# Patient Record
Sex: Female | Born: 1940 | Race: White | Hispanic: No | Marital: Married | Smoking: Current every day smoker
Health system: Southern US, Community
[De-identification: ages and names within clinical notes are randomized; demographics above are authoritative.]

## PROBLEM LIST (undated history)

## (undated) DIAGNOSIS — I10 Essential (primary) hypertension: Secondary | ICD-10-CM

## (undated) DIAGNOSIS — I209 Angina pectoris, unspecified: Secondary | ICD-10-CM

## (undated) DIAGNOSIS — I340 Nonrheumatic mitral (valve) insufficiency: Secondary | ICD-10-CM

## (undated) DIAGNOSIS — I351 Nonrheumatic aortic (valve) insufficiency: Secondary | ICD-10-CM

## (undated) DIAGNOSIS — I739 Peripheral vascular disease, unspecified: Secondary | ICD-10-CM

## (undated) DIAGNOSIS — J449 Chronic obstructive pulmonary disease, unspecified: Secondary | ICD-10-CM

## (undated) DIAGNOSIS — K219 Gastro-esophageal reflux disease without esophagitis: Secondary | ICD-10-CM

## (undated) DIAGNOSIS — I2510 Atherosclerotic heart disease of native coronary artery without angina pectoris: Secondary | ICD-10-CM

## (undated) DIAGNOSIS — E7800 Pure hypercholesterolemia, unspecified: Secondary | ICD-10-CM

## (undated) DIAGNOSIS — I779 Disorder of arteries and arterioles, unspecified: Secondary | ICD-10-CM

## (undated) DIAGNOSIS — I480 Paroxysmal atrial fibrillation: Secondary | ICD-10-CM

## (undated) HISTORY — PX: ABDOMINAL HYSTERECTOMY: SHX81

## (undated) HISTORY — DX: Paroxysmal atrial fibrillation: I48.0

## (undated) HISTORY — DX: Nonrheumatic mitral (valve) insufficiency: I34.0

## (undated) HISTORY — DX: Chronic obstructive pulmonary disease, unspecified: J44.9

## (undated) HISTORY — DX: Peripheral vascular disease, unspecified: I73.9

## (undated) HISTORY — DX: Nonrheumatic aortic (valve) insufficiency: I35.1

## (undated) HISTORY — DX: Disorder of arteries and arterioles, unspecified: I77.9

---

## 1999-02-14 HISTORY — PX: SUBCLAVIAN ARTERY STENT: SHX2452

## 1999-02-14 HISTORY — PX: OTHER SURGICAL HISTORY: SHX169

## 1999-02-14 HISTORY — PX: RENAL ARTERY STENT: SHX2321

## 1999-02-15 ENCOUNTER — Inpatient Hospital Stay (HOSPITAL_COMMUNITY): Admission: RE | Admit: 1999-02-15 | Discharge: 1999-02-21 | Payer: Self-pay | Admitting: *Deleted

## 1999-02-15 ENCOUNTER — Encounter: Payer: Self-pay | Admitting: Cardiology

## 1999-02-23 ENCOUNTER — Inpatient Hospital Stay (HOSPITAL_COMMUNITY): Admission: AD | Admit: 1999-02-23 | Discharge: 1999-02-28 | Payer: Self-pay | Admitting: Cardiovascular Disease

## 1999-04-16 HISTORY — PX: RENAL ARTERY STENT: SHX2321

## 1999-04-24 ENCOUNTER — Observation Stay (HOSPITAL_COMMUNITY): Admission: RE | Admit: 1999-04-24 | Discharge: 1999-04-25 | Payer: Self-pay | Admitting: Cardiovascular Disease

## 2003-04-16 DIAGNOSIS — I2510 Atherosclerotic heart disease of native coronary artery without angina pectoris: Secondary | ICD-10-CM

## 2003-04-16 HISTORY — PX: CARDIAC CATHETERIZATION: SHX172

## 2003-04-16 HISTORY — DX: Atherosclerotic heart disease of native coronary artery without angina pectoris: I25.10

## 2003-05-04 ENCOUNTER — Ambulatory Visit (HOSPITAL_COMMUNITY): Admission: RE | Admit: 2003-05-04 | Discharge: 2003-05-04 | Payer: Self-pay | Admitting: *Deleted

## 2003-06-14 HISTORY — PX: RENAL ARTERY ANGIOPLASTY: SHX2317

## 2003-06-14 HISTORY — PX: SUBCLAVIAN ARTERY STENT: SHX2452

## 2003-07-11 ENCOUNTER — Ambulatory Visit (HOSPITAL_COMMUNITY): Admission: RE | Admit: 2003-07-11 | Discharge: 2003-07-12 | Payer: Self-pay | Admitting: Cardiovascular Disease

## 2003-07-14 ENCOUNTER — Emergency Department (HOSPITAL_COMMUNITY): Admission: EM | Admit: 2003-07-14 | Discharge: 2003-07-14 | Payer: Self-pay | Admitting: Emergency Medicine

## 2005-06-18 ENCOUNTER — Ambulatory Visit (HOSPITAL_COMMUNITY): Admission: RE | Admit: 2005-06-18 | Discharge: 2005-06-18 | Payer: Self-pay | Admitting: Internal Medicine

## 2005-06-18 ENCOUNTER — Ambulatory Visit: Payer: Self-pay | Admitting: Internal Medicine

## 2005-06-18 ENCOUNTER — Encounter (INDEPENDENT_AMBULATORY_CARE_PROVIDER_SITE_OTHER): Payer: Self-pay | Admitting: Internal Medicine

## 2005-09-11 ENCOUNTER — Ambulatory Visit (HOSPITAL_COMMUNITY): Admission: RE | Admit: 2005-09-11 | Discharge: 2005-09-11 | Payer: Self-pay | Admitting: Internal Medicine

## 2009-11-13 ENCOUNTER — Ambulatory Visit (HOSPITAL_COMMUNITY): Admission: RE | Admit: 2009-11-13 | Discharge: 2009-11-13 | Payer: Self-pay | Admitting: Cardiovascular Disease

## 2009-11-13 HISTORY — PX: SUBCLAVIAN ARTERY STENT: SHX2452

## 2009-11-13 HISTORY — PX: RENAL ARTERY ANGIOPLASTY: SHX2317

## 2009-11-16 ENCOUNTER — Ambulatory Visit (HOSPITAL_COMMUNITY): Admission: RE | Admit: 2009-11-16 | Discharge: 2009-11-17 | Payer: Self-pay | Admitting: Cardiovascular Disease

## 2010-06-29 LAB — BASIC METABOLIC PANEL
BUN: 18 mg/dL (ref 6–23)
Creatinine, Ser: 1.05 mg/dL (ref 0.4–1.2)
Glucose, Bld: 124 mg/dL — ABNORMAL HIGH (ref 70–99)
Potassium: 3.7 mEq/L (ref 3.5–5.1)
Sodium: 132 mEq/L — ABNORMAL LOW (ref 135–145)

## 2010-06-29 LAB — CBC
HCT: 35.4 % — ABNORMAL LOW (ref 36.0–46.0)
MCH: 31.3 pg (ref 26.0–34.0)
MCHC: 32.8 g/dL (ref 30.0–36.0)
MCV: 95.4 fL (ref 78.0–100.0)
Platelets: 234 10*3/uL (ref 150–400)

## 2010-08-28 NOTE — Procedures (Signed)
Kara Pacheco, Kara Pacheco               ACCOUNT NO.:  [id number]   MEDICAL RECORD NO.:  [id number]          PATIENT TYPE:  INP   LOCATION:  2503                         FACILITY:  [facility]   PHYSICIAN:  Nanetta Batty, M.D.   DATE OF BIRTH:  1940-12-28   DATE OF PROCEDURE:  DATE OF DISCHARGE:                    PERIPHERAL VASCULAR INVASIVE PROCEDURE   PROCEDURE PERFORMED:  Peripheral angiogram/PT and stent.   Kara Pacheco is a 70 year old frail-appearing Caucasian female with a history  of ischemic heart disease and valvular heart disease as well as  peripheral vascular occlusive disease.  She had moderate coronary artery  disease by catheterization performed by Dr. Jenne Campus on May 04, 2003.  She also has moderate-to-severe aortic insufficiency as well as mitral  regurgitation.  Coronary artery bypass graft and AVR were recommended,  however the patient refused.  She has a history of bilateral renal  artery stenosis status post stenting as well as left subclavian artery  PT and stenting with known high-grade right subclavian artery stenosis  and bilateral carotid disease.  She also has aortoiliac disease.  She  continues to smoke over 1 pack per day and is recalcitrant to risk  factor modification.  Other problems include hypertension,  hyperlipidemia.  She had a negative Myoview stress test performed on  July 14, 2009.  A 2-D echocardiogram revealed a normal left ventricle  function, diastolic dysfunction, moderate mitral regurgitation and mild  aortic stenosis with moderate-to-severe AI.  Carotid Doppler's showed  moderate right and moderate-to-severe left internal carotid artery  stenosis.  Doppler's also suggested high-grade left subclavian in-stent  restenosis with to-and-fro left vertebral flow as well as what appear  to be in-stent restenosis in both renal  arteries and high-grade  bilateral iliac disease.  She does have progressive claudication.  She  presents now for angiography and  potential intervention.   PROCEDURE DESCRIPTION:  The patient was brought to the second floor  [facility] angiographic suite in the post absorptive  state.  She was premedicated with p.o. Valium, p.o. prednisone, IV  Benadryl and Pepcid for contrast allergy prophylaxis as well as IV  Valium and fentanyl.  Her left groin was prepped and shaved in the usual  sterile fashion.  1% Xylocaine was used for local anesthesia.  A 5-  French sheath was inserted into the left femoral artery using standard  Seldinger technique.  A 5-French short pigtail catheter was used for  abdominal aortography with bifemoral runoff using bolus-chase addition  and subtraction step table technique.  A 5-French long Judkins catheter  was used for selective right innominate angiography, selective left  subclavian angiography and selective bilateral renal artery angiography.  Visipaque dye was used for the entirety of the case.  Retrograde aortic  pressure was monitored during the case.  The patient did require IV  nitroglycerine for treatment of severe central hypertension.   ANGIOGRAPHIC RESULTS:  1. Right innominate:  A 90% ostial right subclavian artery stenosis      post dilatation.  2. Left subclavian:  95% in-stent restenosis.  3. Abdominal aorta:      a.     Renal  arteries - 80% bilateral in-stent restenosis.      b.     Infrarenal abdominal aorta revealed moderate calcification       and atherosclerotic changes.  4. Left lower extremity:      a.     90% calcific ostial left common iliac artery stenosis.      b.     50% sequential mid-left superficial femoral artery stenosis       with 2-vessel runoff.  5. Right lower extremity:      a.     80% hypodense calcific proximal right common iliac artery       stenosis with 2-vessel runoff.   IMPRESSION:  Kara Pacheco has in-stent restenosis within the left subclavian  and bilateral renal artery stents as well as high-grade calcific iliac   disease.  I believe her iliac stenoses would best be treated with  diamond-back orbital rotational atherectomy, percutaneous transluminal  angioplasty and stenting.  We will proceed with percutaneous  transluminal angioplasty and re-stenting of her  subclavian renal  arteries.   A 5-French sheath was exchanged over wire for a 6-French sheath.  The  patient received Angiomax bolus because of a question of an allergy to  heparin.  Visipaque dye was used for the entirety of the intervention.  Retrograde aortic pressures were monitored during the case.   Using a short 6-French JR4 guide catheter along with an 0.14 190 [place]-  Core wire and a 40 x 15 Aviator balloon, percutaneous transluminal  angioplasty on the right renal artery.  In-stent restenosis had nominal  pressures.  Following this, a 5 x 15 Genesis on Aviator balloon stent  premount was then carefully placed across the stenotic area and dilated  to 10 to 12 atmospheres resulting in reduction of an 80% in-stent  restenosis to a less than 20% without dissection.  Attempts were than  made to intervene on the left renal with both the short right Judkins  and short IMA catheters; however, I was never able to fully engage andpass a wire into the left renal artery.   Following this, a Woolly wire was then used to gain access to the  thoracic aorta and a long 5-French JR4 catheter was then used to  cannulate the left subclavian artery stent.  The lesion was crossed with  a long 0.35 angled glide-wire and a long 4-French __________ catheter  was then used to exchange for a long 0.35 Rosen wire.  The short 6-  [place] sheath was then exchanged for a 90-cm long bright tip sheath and  predilatation was performed with a 4 x 2 PowerFlex.  Following this,  stenting was performed with an 8 x 18 Genesis on Opta balloon stent  premount and this was dilated to 10 atmospheres times 2 resulting in  reduction of a 95% in-stent restenosis to less than  20% residual with  excellent flow.  The patient tolerated the procedure well.  The long  sheath was then exchanged over wire for a short 6-French sheath.  The  patient left the lab in stable condition.  She received a total of 250  mL of contrast.   IMPRESSION:  Successful percutaneous transluminal angioplasty and re-  stenting of right renal artery in-stent restenosis and left subclavian  artery in-stent restenosis.  She has residual left renal artery in-  stent restenosis and bilateral iliac disease, which will be addressed  at a separate time.  She will be hydrated overnight, treated with  aspirin and Plavix  and discharged home in the morning.  She left the lab  in stable condition.      Nanetta Batty, M.D.     Cordelia Pen  D:  11/16/2009  T:  11/16/2009  Job:  [id number]   cc:   Angiographic Suite 2nd Floor [facility]  [facility]  Madelin Rear. Sherwood Gambler, MD   Electronically Signed by Nanetta Batty M.D. on 12/04/2009 10:20:52 AM

## 2010-08-31 NOTE — Cardiovascular Report (Signed)
NAME:  Kara Pacheco, Kara Pacheco                         ACCOUNT NO.:  [id number]   MEDICAL RECORD NO.:  [id number]                   PATIENT TYPE:  OIB   LOCATION:  2870                                 FACILITY:  [facility]   PHYSICIAN:  Darlin Priestly, M.D.             DATE OF BIRTH:  04/14/1941   DATE OF PROCEDURE:  05/04/2003  DATE OF DISCHARGE:  05/04/2003                              CARDIAC CATHETERIZATION   PROCEDURES:  1. Left heart catheterization.  2. Coronary angiography.  3. Left ventriculogram.  4. Left subclavian angiography.  5. Ascending aortography.  6. Bilateral renal angiograms.   CARDIOLOGIST:  Darlin Priestly, M.D.   COMPLICATIONS:  None.   INDICATIONS:  Kara Pacheco is a 70 year old female, patient of Dr. Artis Delay, with a history of ongoing tobacco use, history of PVD status post  left subclavian stenting as well as bilateral renal stenting.  The patient  has a history of hypertension, hyperlipidemia.  Recently she has complained  of increasing chest pain with exertion associated with diaphoresis and  nausea.  This has occasionally awoken her from sleep.  The patient is also  known to have moderate regurgitation which has been followed by serial  echocardiogram.  She is now referred for cardiac catheterization to reassess  her coronary status and to evaluate her peripheral stent.   DESCRIPTION OF PROCEDURE:  After giving informed written consent, the  patient was brought to the cardiac catheterization lab where his right and  left groins were shaved, prepped, and draped in the usual sterile fashion.  ECG monitoring was established.  Using modified Seldinger technique,  arterial access was obtained in the right femoral artery and a #6 French  arterial sheath was inserted without complications.  Then, 6 French  diagnostic catheters were then used to perform diagnostic angiography.  This  revealed a large left main with no significant disease.  The LAD is a  medium  size vessel which courses to the apex and gives rise to one diagonal branch.  The LAD is noted to be diffusely irregular with 40-50% throughout its  proximal, mid and distal segments.  There is a focal 60% stenotic lesion  after the takeoff of the first diagonal.  The first diagonal is a medium  size vessel which bifurcates distally.  Its course is irregular but it has  no high grade stenosis.   The left circumflex is a medium size vessel which courses in the AV groove  and gives rise to large obtuse marginal branches.  The AV groove circumflex  is noted to have diffuse 50-60% stenosis in the mid segment with a focal 60-  70% stenosis prior to the takeoff of the second obtuse marginal.  The first  OM is a small vessel with no significant disease.  The second OM is a medium  size vessel which bifurcates distally and has a 50% proximal stenosis.   The right coronary  artery is a large vessel which is dominant and gives rise  to the PDA as well as the posterior lateral branch.  There is an 80% early  mid and 80% distal stenotic lesion.  The PDA is noted to have 40% mid vessel  stenosis.  The PLA has no significant disease.   LEFT VENTRICULOGRAM:  The left ventriculogram reveals preserved EF of 60%  with mild mitral regurgitation.   FINDINGS:  1. The left subclavian stent appears to have approximately 60% in-stent     restenosis throughout its proximal and mid segments.  2. The left renal artery stent appears to have 80-90% in-stent restenosis     throughout its proximal and mid segment.  3. The right renal artery stent appears to have a 70-80% in-stent restenosis     throughout its proximal and mid segments.  4. Ascending aortography reveals moderate severe aortic regurgitation.   HEMODYNAMICS:  Systemic arterial pressure 177/53, LV systemic pressure 175/2  and LVEDP of 11.   CONCLUSION:  1. Significant two vessel coronary artery disease involving the left     anterior  descending and right coronary artery.  2. Normal left ventricular systolic function.  3. Mild mitral regurgitation.  4. Moderate severe aortic regurgitation.  5. 70% in-stent restenosis of the left subclavian stent.  6. 80-90% in-stent restenosis of the left renal artery stent.  7. 70-80% in-stent restenosis of the right renal artery stent.  8. Systemic hypertension.                                               Darlin Priestly, M.D.    RHM/MEDQ  D:  05/04/2003  T:  05/05/2003  Job:  [id number]   cc:   Madelin Rear. Sherwood Gambler, M.D.  [address]  [place]  [place] [address]  Fax: [phone]

## 2010-08-31 NOTE — Op Note (Signed)
NAMESAYRA, Pacheco               ACCOUNT NO.:  [id number]   MEDICAL RECORD NO.:  [id number]          PATIENT TYPE:  AMB   LOCATION:  DAY                           FACILITY:  [facility]   PHYSICIAN:  Lionel December, M.D.    DATE OF BIRTH:  [birth year]   DATE OF PROCEDURE:  06/18/2005  DATE OF DISCHARGE:                                 OPERATIVE REPORT   PROCEDURE:  Colonoscopy with polypectomy.   INDICATION:  Kara Pacheco is a 70 year old Caucasian female who is here for  surveillance colonoscopy. She had an adenoma removed on her last colonoscopy  in 2000. Family history is significant for colon carcinoma in a paternal  uncle. Procedure and risks were reviewed with the patient and informed  consent was obtained.   MEDICINES FOR CONSCIOUS SEDATION:  Demerol 25 mg IV, Versed 4 mg IV.   FINDINGS:  Procedure performed in endoscopy suite. The patient's vital signs  and O2 saturations were monitored during the procedure and remained stable.  The patient was placed in the left lateral position and rectal examination  performed. No abnormality noted on external or digital exam. Olympus  videoscope was placed in the rectum and advanced under vision, into sigmoid  colon and beyond. Preparation was excellent. Scope was passed into the cecum  which was identified by ileocecal valve and appendiceal orifice. There were  2 cecal arteriovenous malformations; one was large and one was small, to the  left of the appendiceal orifice. These were not bleeding and were left  alone. There was an 8 mm sessile polyp between the appendiceal orifice, and  the ileocecal valve. This was raised with submucosal injection of saline,  and it was snared in 1 piece. As this polyp was suctioned through the  channel, it broke into pieces. Polypectomy was complete. As the scope was  withdrawn the colonic mucosa was, once again,  carefully examined. There was  another tiny polyp at the hepatic flexure which was coagulated using  snare  tip. Mucosa and rest of the colon was normal. Rectal mucosa similarly was  normal. Scope was retroflexed to examine anorectal junction which was  unremarkable. Endoscope was straightened and withdrawn. The patient  tolerated the procedure well.   FINAL DIAGNOSIS:  1.  Nonbleeding cecal arteriovenous malformations (2) which were left alone.  2.  An 8-mm, sessile polyp snared from cecum after submucosal injection of      saline.  3.  Tiny polyp at hepatic flexure was coagulated.   RECOMMENDATIONS:  Standard instructions given. She will resume her ASA in 1  week. I will be contacting the patient with biopsy results. Given today's  findings, she will need to return for another exam in 5 years from now.     Lionel December, M.D.  Electronically Signed    NR/MEDQ  D:  06/18/2005  T:  06/18/2005  Job:  [id number]

## 2010-08-31 NOTE — Cardiovascular Report (Signed)
NAME:  Kara Pacheco, Kara Pacheco                         ACCOUNT NO.:  [id number]   MEDICAL RECORD NO.:  [id number]                   PATIENT TYPE:  OIB   LOCATION:  6522                                 FACILITY:  [facility]   PHYSICIAN:  Nanetta Batty, M.D.                DATE OF BIRTH:  09-11-1940   DATE OF PROCEDURE:  07/11/2003  DATE OF DISCHARGE:                              CARDIAC CATHETERIZATION   INDICATIONS:  Kara Pacheco is a 70 year old white female with history of  coronary artery disease, valvular heart disease and PVOD.  She has had  bilateral renal artery PTA and stenting in the past by Dr. Susa Griffins  as well as left subclavian artery PTA and stenting.  She has moderate three-  vessel disease by cath this past January with moderate aortic insufficiency  by 2-D echo.  Dr. Jenne Campus also documented in-stent restenosis within the  left subclavian and bilateral renal artery stents.  Dopplers confirmed this.  The patient presents now for PTA.   PROCEDURE DESCRIPTION:  The patient was brought to the sixth floor [facility]iographic Suite in the postabsorptive state.  She was premedicated with p.o. Valium and received IV morphine, Versed and  fentanyl.  Her right groin was prepped and shaved in the usual sterile  fashion.  1% Xylocaine was used for local anesthesia.  A 7 French sheath was  inserted into the right femoral artery using standard Seldinger technique.  A 5 French long pigtail catheter, right Judkins catheter, and JB-1 catheter  were used for arch angiography, selective right innominate, right subclavian  and left subclavian angiography, distal abdominal aortography and selective  bilateral renal angiography.  Visipaque dye was used for the entirety of the  case.  Retrograde aortic pressure was monitored during the case.   ANGIOGRAPHIC RESULTS:  1. Arch aortogram.     A. This was a bovine arch with the left common carotid arising from the  innominate vessel.  The innominate was itself free of significant        disease.  There did appear to be an 80-90% fairly focal proximal right        subclavian artery stenosis.     B. 70% in-stent restenosis with post stenotic dilatation within the left        subclavian artery stent.  2. Abdominal aortography.     A. 80% in-stent restenosis within the bilateral renal artery stents.     B. Moderate infrarenal abdominal aortic atherosclerotic narrowing        extending into the origins of both iliac arteries in the 50-60% range.   PROCEDURE DESCRIPTION:  The patient received 3000 units of heparin  intravenously.  Access to the left subclavian was obtained with a JB-1  catheter and 0.035 long Wholey wire.  The short 7 French sheath was  exchanged for a 90 cm bright tip Cordis sheath which was advanced  up to the  [facility] wire to the origin of the left subclavian.  PTA was performed with a  7/2 PowerFlex at 10 and 12 atmospheres resulting in reduction of a 70% in-  stent restenosis to less than 20-30% residual.   The long 90 cm 7 French sheath was then exchanged for the short 7 [place]  sheath.  Using a 6 [place] renal double curve short guide catheter as well as  an 0.014 190 stabilizer wire and a 4 upgraded to a 5 x 1.5 Aviator, PTA was  performed on the left followed by the right renal artery at nominal  pressures up to 10 and 12 atmospheres resulting in reduction of 80%  bilateral in-stent restenosis within the renal artery stents to less than 20-  30% residual.  The patient tolerated the procedure well.  The guide wire and  catheters removed. An ACT was measured and the sheath was removed.  Pressure  was held on the groin to achieve hemostasis.  The patient left the lab in  stable condition.  She will be hydrated overnight and discharged home in the  morning and will obtain followup Dopplers of her upper extremities and renal  arteries.  After which time, she will see me back in  followup.   I did take the opportunity to review her recent cardiac catheterization  films and feel that her coronary artery is percutaneously addressable if the  valvular abnormality is not symptomatic and requiring surgery at this time.  I will review this with Dr. Lenise Herald.                                               Nanetta Batty, M.D.    Cordelia Pen  D:  07/11/2003  T:  07/12/2003  Job:  [id number]   cc:   [facility]  [address]  [place], [place] [address]   Darlin Priestly, M.D.  [phone] [address]  [place]  [place] [address]  Fax: [phone]   Madelin Rear. Sherwood Gambler, M.D.  [address]  [place]  [place] [address]  Fax: [phone]

## 2010-11-12 ENCOUNTER — Encounter (INDEPENDENT_AMBULATORY_CARE_PROVIDER_SITE_OTHER): Payer: Self-pay | Admitting: *Deleted

## 2010-11-12 ENCOUNTER — Telehealth (INDEPENDENT_AMBULATORY_CARE_PROVIDER_SITE_OTHER): Payer: Self-pay | Admitting: *Deleted

## 2010-11-12 ENCOUNTER — Other Ambulatory Visit (INDEPENDENT_AMBULATORY_CARE_PROVIDER_SITE_OTHER): Payer: Self-pay | Admitting: *Deleted

## 2010-11-12 DIAGNOSIS — Z86010 Personal history of colonic polyps: Secondary | ICD-10-CM

## 2010-11-12 DIAGNOSIS — Z800 Family history of malignant neoplasm of digestive organs: Secondary | ICD-10-CM

## 2010-11-12 NOTE — Telephone Encounter (Signed)
Pt need movi prep escribed  TCS sch'd 02/27/11 @ 7:30 (6:30), ASA 2 days, pt aware, inst mailed

## 2010-11-13 MED ORDER — PEG-KCL-NACL-NASULF-NA ASC-C 100 G PO SOLR: 1.0000 | Freq: Once | ORAL | Status: DC

## 2010-11-13 NOTE — Telephone Encounter (Signed)
Addended by: Len Blalock on: 11/13/2010 09:17 AM   Modules accepted: Orders

## 2011-02-01 ENCOUNTER — Other Ambulatory Visit (INDEPENDENT_AMBULATORY_CARE_PROVIDER_SITE_OTHER): Payer: Self-pay | Admitting: *Deleted

## 2011-02-01 DIAGNOSIS — Z86010 Personal history of colonic polyps: Secondary | ICD-10-CM

## 2011-02-01 DIAGNOSIS — Z800 Family history of malignant neoplasm of digestive organs: Secondary | ICD-10-CM

## 2011-02-12 ENCOUNTER — Encounter (HOSPITAL_COMMUNITY): Payer: Self-pay | Admitting: Pharmacy Technician

## 2011-02-26 MED ORDER — SODIUM CHLORIDE 0.45 % IV SOLN
Freq: Once | INTRAVENOUS | Status: AC
  Administered 2011-02-27: 08:00:00 via INTRAVENOUS

## 2011-02-27 ENCOUNTER — Ambulatory Visit (HOSPITAL_COMMUNITY)
Admission: RE | Admit: 2011-02-27 | Discharge: 2011-02-27 | Disposition: A | Discharge status: Home / Self Care | Payer: Medicare Other | Source: Ambulatory Visit | Attending: Internal Medicine | Admitting: Internal Medicine

## 2011-02-27 ENCOUNTER — Encounter (HOSPITAL_COMMUNITY)
Admission: RE | Disposition: A | Discharge status: Home / Self Care | Payer: Self-pay | Source: Ambulatory Visit | Attending: Internal Medicine

## 2011-02-27 ENCOUNTER — Encounter (HOSPITAL_COMMUNITY): Payer: Self-pay | Admitting: *Deleted

## 2011-02-27 DIAGNOSIS — K5730 Diverticulosis of large intestine without perforation or abscess without bleeding: Secondary | ICD-10-CM

## 2011-02-27 DIAGNOSIS — E7800 Pure hypercholesterolemia, unspecified: Secondary | ICD-10-CM | POA: Insufficient documentation

## 2011-02-27 DIAGNOSIS — Z09 Encounter for follow-up examination after completed treatment for conditions other than malignant neoplasm: Secondary | ICD-10-CM | POA: Insufficient documentation

## 2011-02-27 DIAGNOSIS — Z860100 Personal history of colon polyps, unspecified: Secondary | ICD-10-CM | POA: Insufficient documentation

## 2011-02-27 DIAGNOSIS — Z79899 Other long term (current) drug therapy: Secondary | ICD-10-CM | POA: Insufficient documentation

## 2011-02-27 DIAGNOSIS — Z800 Family history of malignant neoplasm of digestive organs: Secondary | ICD-10-CM

## 2011-02-27 DIAGNOSIS — I10 Essential (primary) hypertension: Secondary | ICD-10-CM | POA: Insufficient documentation

## 2011-02-27 DIAGNOSIS — K562 Volvulus: Secondary | ICD-10-CM

## 2011-02-27 HISTORY — DX: Atherosclerotic heart disease of native coronary artery without angina pectoris: I25.10

## 2011-02-27 HISTORY — PX: COLONOSCOPY: SHX5424

## 2011-02-27 HISTORY — DX: Essential (primary) hypertension: I10

## 2011-02-27 HISTORY — DX: Gastro-esophageal reflux disease without esophagitis: K21.9

## 2011-02-27 HISTORY — DX: Angina pectoris, unspecified: I20.9

## 2011-02-27 HISTORY — DX: Pure hypercholesterolemia, unspecified: E78.00

## 2011-02-27 SURGERY — COLONOSCOPY
Anesthesia: Moderate Sedation

## 2011-02-27 MED ORDER — MIDAZOLAM HCL 5 MG/5ML IJ SOLN
INTRAMUSCULAR | Status: AC
  Filled 2011-02-27: qty 10

## 2011-02-27 MED ORDER — MEPERIDINE HCL 50 MG/ML IJ SOLN
INTRAMUSCULAR | Status: AC
  Filled 2011-02-27: qty 1

## 2011-02-27 MED ORDER — STERILE WATER FOR IRRIGATION IR SOLN
Status: DC | PRN
  Administered 2011-02-27: 08:00:00

## 2011-02-27 MED ORDER — ENALAPRILAT 1.25 MG/ML IV SOLN
2.5000 mg | Freq: Once | INTRAVENOUS | Status: AC
  Administered 2011-02-27: 2.5 mg via INTRAVENOUS

## 2011-02-27 MED ORDER — MEPERIDINE HCL 50 MG/ML IJ SOLN
INTRAMUSCULAR | Status: DC | PRN
  Administered 2011-02-27 (×2): 25 mg via INTRAVENOUS

## 2011-02-27 MED ORDER — MIDAZOLAM HCL 5 MG/5ML IJ SOLN
INTRAMUSCULAR | Status: DC | PRN
  Administered 2011-02-27 (×2): 1 mg via INTRAVENOUS
  Administered 2011-02-27: 2 mg via INTRAVENOUS
  Administered 2011-02-27 (×2): 1 mg via INTRAVENOUS

## 2011-02-27 MED ORDER — ENALAPRILAT 1.25 MG/ML IV SOLN
INTRAVENOUS | Status: AC
  Filled 2011-02-27: qty 2

## 2011-02-27 NOTE — H&P (Signed)
Kara Pacheco is an 70 y.o. female.   Chief Complaint: Patient is here for colonoscopy. HPI: Patient is 70 year old Caucasian female who is here for surveillance colonoscopy. Last exam was 2007 with removal of single tubulovillous adenoma; smaller polyp was coagulated. She she is free of GI symptoms. She denies abdominal pain, change in her bowel habits or rectal bleeding. Family history is significant for colon carcinoma in a paternal uncle in his late 5s and he had another primary 12 years later and died at 25.  Past Medical History  Diagnosis Date  . Hypertension   . Hypercholesteremia   . Coronary artery disease   . Angina   . GERD (gastroesophageal reflux disease)     Past Surgical History  Procedure Date  . Abdominal hysterectomy   . Cardiac catheterization     with 4 stents  . Right femoral artery repair     Family History  Problem Relation Age of Onset  . Colon cancer Paternal Uncle    Social History:  reports that she has been smoking Cigarettes.  She has a 51 pack-year smoking history. She does not have any smokeless tobacco history on file. She reports that she does not drink alcohol or use illicit drugs.  Allergies:  Allergies  Allergen Reactions  . Altace   . Contrast Media (Iodinated Diagnostic Agents)   . Diltiazem   . Heparin   . Monistat   . Penicillins   . Plavix (Clopidogrel Bisulfate)   . Sulfa Antibiotics     Medications Prior to Admission  Medication Dose Route Frequency Provider Last Rate Last Dose  . 0.45 % sodium chloride infusion   Intravenous Once Malissa Hippo, MD 20 mL/hr at 02/27/11 0736     Medications Prior to Admission  Medication Sig Dispense Refill  . Ascorbic Acid (VITAMIN C) 100 MG tablet Take 100 mg by mouth daily.        Marland Kitchen aspirin 325 MG tablet Take 325 mg by mouth daily.        Marland Kitchen diltiazem (TIAZAC) 180 MG 24 hr capsule Take 180 mg by mouth 2 (two) times daily.        Marland Kitchen doxazosin (CARDURA) 4 MG tablet Take 4 mg by mouth at  bedtime.        Marland Kitchen ezetimibe-simvastatin (VYTORIN) 10-20 MG per tablet Take 1 tablet by mouth at bedtime.        . fish oil-omega-3 fatty acids 1000 MG capsule Take 2 g by mouth daily.        . folic acid (FOLVITE) 1 MG tablet Take 1 mg by mouth daily.        Marland Kitchen losartan (COZAAR) 50 MG tablet Take 50 mg by mouth daily.        . metoprolol tartrate (LOPRESSOR) 25 MG tablet Take 12.5 mg by mouth 2 (two) times daily.        . peg 3350 powder (MOVIPREP) 100 G SOLR Take 1 kit (100 g total) by mouth once.  1 kit  0  . vitamin E 100 UNIT capsule Take 100 Units by mouth daily.          No results found for this or any previous visit (from the past 48 hour(s)). No results found.  Review of Systems  Constitutional: Negative for weight loss.  Gastrointestinal: Negative for abdominal pain, diarrhea, constipation, blood in stool and melena.    Blood pressure 189/65, pulse 72, temperature 98.6 F (37 C), temperature source Oral, resp. rate  20, height 5' (1.524 m), weight 103 lb (46.72 kg), SpO2 98.00%. Physical Exam  Constitutional: She appears well-developed and well-nourished.  HENT:  Mouth/Throat: Oropharynx is clear and moist.  Eyes: Conjunctivae are normal. No scleral icterus.  Neck: No thyromegaly present.  Cardiovascular: Normal rate, regular rhythm and normal heart sounds.   No murmur heard. Respiratory: Effort normal and breath sounds normal.  GI: Soft. She exhibits no mass. There is no tenderness.  Musculoskeletal: She exhibits no edema.  Lymphadenopathy:    She has no cervical adenopathy.  Neurological: She is alert.  Skin: Skin is warm and dry.     Assessment/Plan History of colonic polyps. Surveillance colonoscopy.  REHMAN,NAJEEB U 02/27/2011, 7:50 AM

## 2011-02-27 NOTE — Op Note (Signed)
COLONOSCOPY PROCEDURE REPORT  PATIENT:  Kara Pacheco [facility]  MR#:  [id number] Birthdate:  1940-09-04, 70 y.o., female Endoscopist:  Dr. Malissa Hippo, MD Referred By:  Dr. Elfredia Nevins, MD. Procedure Date: 02/27/2011  Procedure:   Colonoscopy  Indications:  Patient is 70 year old Caucasian female with history of colonic adenomas. She is undergoing surveillance colonoscopy.  Informed Consent: Procedure and risks were reviewed with the patient and informed consent was obtained.  Please see history & physical in medical record for more information.  Medications:  Demerol 50 mg IV Versed 6 mg IV Enalapril 2.5 mg IV  Description of procedure: Patient was placed in left lateral recumbent position. Patient's systolic blood pressure was over 200. She was given enalapril 0.5 mg IV with satisfactory response. No abnormality noted on external or digital exam. Pantex videoscope was placed the rectum and advanced in a vision and sigmoid colon which is very tortuous. Scope is passed into splenic flexure and just beyond but could not complete the exam is of loop formation in inability to reduce it or prevented from forming. As the scope was slowly withdrawn mucosa of descending, sigmoid and rectum was carefully examined along with retroflexion in the rectum.    Findings:   Prep excellent. Single diverticulum at proximal sigmoid colon. Incomplete exam to splenic flexure.  Therapeutic/Diagnostic Maneuvers Performed:  None  Complications:  None  Cecal Withdrawal Time:  N/A ; cecum not reached  Impression:  Incomplete exam because of tortuous sigmoid colon and inability to reduce or prevent loop formation. Single diverticulum at sigmoid colon. Unremarkable anorectal junction.  Recommendations:  Standard instructions given. Schedule patient for barium enema at a later date.  Mishika Flippen U  02/27/2011 8:28 AM  CC: Dr. Elfredia Nevins, M.D.

## 2011-03-04 ENCOUNTER — Encounter (INDEPENDENT_AMBULATORY_CARE_PROVIDER_SITE_OTHER): Payer: Self-pay | Admitting: *Deleted

## 2011-03-05 ENCOUNTER — Other Ambulatory Visit (INDEPENDENT_AMBULATORY_CARE_PROVIDER_SITE_OTHER): Payer: Self-pay | Admitting: Internal Medicine

## 2011-03-05 ENCOUNTER — Encounter (HOSPITAL_COMMUNITY): Payer: Self-pay | Admitting: Internal Medicine

## 2011-03-05 DIAGNOSIS — K639 Disease of intestine, unspecified: Secondary | ICD-10-CM

## 2011-04-02 ENCOUNTER — Ambulatory Visit (HOSPITAL_COMMUNITY)
Admission: RE | Admit: 2011-04-02 | Discharge: 2011-04-02 | Disposition: A | Discharge status: Home / Self Care | Payer: Medicare Other | Source: Ambulatory Visit | Attending: Internal Medicine | Admitting: Internal Medicine

## 2011-04-02 DIAGNOSIS — K5730 Diverticulosis of large intestine without perforation or abscess without bleeding: Secondary | ICD-10-CM | POA: Insufficient documentation

## 2011-04-02 DIAGNOSIS — Z860100 Personal history of colon polyps, unspecified: Secondary | ICD-10-CM | POA: Insufficient documentation

## 2011-04-02 DIAGNOSIS — K639 Disease of intestine, unspecified: Secondary | ICD-10-CM

## 2011-05-22 ENCOUNTER — Emergency Department (HOSPITAL_COMMUNITY): Payer: Medicare Other

## 2011-05-22 ENCOUNTER — Encounter (HOSPITAL_COMMUNITY): Payer: Self-pay | Admitting: *Deleted

## 2011-05-22 ENCOUNTER — Emergency Department (HOSPITAL_COMMUNITY)
Admission: EM | Admit: 2011-05-22 | Discharge: 2011-05-22 | Disposition: A | Discharge status: Home / Self Care | Payer: Medicare Other | Attending: Emergency Medicine | Admitting: Emergency Medicine

## 2011-05-22 DIAGNOSIS — W19XXXA Unspecified fall, initial encounter: Secondary | ICD-10-CM | POA: Insufficient documentation

## 2011-05-22 DIAGNOSIS — K219 Gastro-esophageal reflux disease without esophagitis: Secondary | ICD-10-CM | POA: Insufficient documentation

## 2011-05-22 DIAGNOSIS — I2510 Atherosclerotic heart disease of native coronary artery without angina pectoris: Secondary | ICD-10-CM | POA: Insufficient documentation

## 2011-05-22 DIAGNOSIS — Z7982 Long term (current) use of aspirin: Secondary | ICD-10-CM | POA: Insufficient documentation

## 2011-05-22 DIAGNOSIS — Z79899 Other long term (current) drug therapy: Secondary | ICD-10-CM | POA: Insufficient documentation

## 2011-05-22 DIAGNOSIS — Y998 Other external cause status: Secondary | ICD-10-CM | POA: Insufficient documentation

## 2011-05-22 DIAGNOSIS — Y92009 Unspecified place in unspecified non-institutional (private) residence as the place of occurrence of the external cause: Secondary | ICD-10-CM | POA: Insufficient documentation

## 2011-05-22 DIAGNOSIS — S8263XA Displaced fracture of lateral malleolus of unspecified fibula, initial encounter for closed fracture: Secondary | ICD-10-CM | POA: Insufficient documentation

## 2011-05-22 DIAGNOSIS — F17200 Nicotine dependence, unspecified, uncomplicated: Secondary | ICD-10-CM | POA: Insufficient documentation

## 2011-05-22 DIAGNOSIS — I10 Essential (primary) hypertension: Secondary | ICD-10-CM | POA: Insufficient documentation

## 2011-05-22 DIAGNOSIS — S82899A Other fracture of unspecified lower leg, initial encounter for closed fracture: Secondary | ICD-10-CM

## 2011-05-22 DIAGNOSIS — E7800 Pure hypercholesterolemia, unspecified: Secondary | ICD-10-CM | POA: Insufficient documentation

## 2011-05-22 MED ORDER — HYDROCODONE-ACETAMINOPHEN 7.5-325 MG PO TABS: 1.0000 | ORAL_TABLET | ORAL | Status: AC | PRN

## 2011-05-22 MED ORDER — HYDROCODONE-ACETAMINOPHEN 5-325 MG PO TABS
2.0000 | ORAL_TABLET | Freq: Once | ORAL | Status: AC
  Administered 2011-05-22: 2 via ORAL
  Filled 2011-05-22: qty 2

## 2011-05-22 MED ORDER — ONDANSETRON HCL 4 MG PO TABS
4.0000 mg | ORAL_TABLET | Freq: Once | ORAL | Status: AC
  Administered 2011-05-22: 4 mg via ORAL
  Filled 2011-05-22: qty 1

## 2011-05-22 NOTE — ED Provider Notes (Signed)
History     CSN: [id number]  Arrival date & time 05/22/11  1127   None     Chief Complaint  Patient presents with  . Fall    (Consider location/radiation/quality/duration/timing/severity/associated sxs/prior treatment) Patient is a 71 y.o. female presenting with fall. The history is provided by the patient and the spouse.  Fall The fall occurred while standing. She landed on a hard floor. Point of impact: left side. Pain location: right ankle. The pain is severe. She was ambulatory at the scene. Associated symptoms include tingling. Pertinent negatives include no visual change, no abdominal pain, no bowel incontinence, no vomiting, no hematuria and no loss of consciousness. The symptoms are aggravated by pressure on the injury. She has tried nothing for the symptoms.    Past Medical History  Diagnosis Date  . Hypertension   . Hypercholesteremia   . Coronary artery disease   . Angina   . GERD (gastroesophageal reflux disease)     Past Surgical History  Procedure Date  . Abdominal hysterectomy   . Cardiac catheterization     with 4 stents  . Right femoral artery repair   . Colonoscopy 02/27/2011    Procedure: COLONOSCOPY;  Surgeon: Malissa Hippo, MD;  Location: [facility];  Service: Endoscopy;  Laterality: N/A;  7:30 am    Family History  Problem Relation Age of Onset  . Colon cancer Paternal Uncle     History  Substance Use Topics  . Smoking status: Current Everyday Smoker -- 1.0 packs/day for 51 years    Types: Cigarettes  . Smokeless tobacco: Not on file  . Alcohol Use: No    OB History    Grav Para Term Preterm Abortions TAB SAB Ect Mult Living                  Review of Systems  Constitutional: Negative for activity change.       All ROS Neg except as noted in HPI  HENT: Negative for nosebleeds and neck pain.   Eyes: Negative for photophobia and discharge.  Respiratory: Negative for cough, shortness of breath and wheezing.   Cardiovascular:  Positive for chest pain. Negative for palpitations.  Gastrointestinal: Negative for vomiting, abdominal pain, blood in stool and bowel incontinence.       Indigestion  Genitourinary: Negative for dysuria, frequency and hematuria.  Musculoskeletal: Negative for back pain and arthralgias.       Joint pain  Skin: Negative.   Neurological: Positive for tingling. Negative for dizziness, seizures, loss of consciousness and speech difficulty.  Psychiatric/Behavioral: Negative for hallucinations and confusion.    Allergies  Altace; Contrast media; Diltiazem; Heparin; Monistat; Penicillins; Plavix; and Sulfa antibiotics  Home Medications   Current Outpatient Rx  Name Route Sig Dispense Refill  . VITAMIN C 100 MG PO TABS Oral Take 100 mg by mouth daily.      . ASPIRIN 325 MG PO TABS Oral Take 325 mg by mouth daily.      Marland Kitchen DILTIAZEM HCL ER BEADS 180 MG PO CP24 Oral Take 180 mg by mouth 2 (two) times daily.      Marland Kitchen DOXAZOSIN MESYLATE 4 MG PO TABS Oral Take 4 mg by mouth at bedtime.     Marland Kitchen EZETIMIBE-SIMVASTATIN 10-20 MG PO TABS Oral Take 1 tablet by mouth at bedtime.      . OMEGA-3 FATTY ACIDS 1000 MG PO CAPS Oral Take 2 g by mouth daily.      Marland Kitchen LOSARTAN POTASSIUM 50  MG PO TABS Oral Take 50 mg by mouth daily.      Marland Kitchen METOPROLOL TARTRATE 25 MG PO TABS Oral Take 12.5 mg by mouth 2 (two) times daily.      Marland Kitchen VITAMIN E 100 UNITS PO CAPS Oral Take 100 Units by mouth daily.      Marland Kitchen FOLIC ACID 1 MG PO TABS Oral Take 1 mg by mouth daily. Patient does not take this med as prescribed      BP 147/31  Pulse 50  Temp(Src) 97.8 F (36.6 C) (Oral)  Resp 16  Ht 5' (1.524 m)  Wt 103 lb (46.72 kg)  BMI 20.12 kg/m2  SpO2 97%  Physical Exam  Nursing note and vitals reviewed. Constitutional: She is oriented to person, place, and time. She appears well-developed and well-nourished.  Non-toxic appearance.  HENT:  Head: Normocephalic.  Right Ear: Tympanic membrane and external ear normal.  Left Ear: Tympanic  membrane and external ear normal.  Eyes: EOM and lids are normal. Pupils are equal, round, and reactive to light.  Neck: Normal range of motion. Neck supple. Carotid bruit is not present.  Cardiovascular: Regular rhythm, normal heart sounds, intact distal pulses and normal pulses.  Bradycardia present.   Pulmonary/Chest: Breath sounds normal. No respiratory distress.       No chest wall or rib area tenderness.  Abdominal: Soft. Bowel sounds are normal. There is no tenderness. There is no guarding.  Musculoskeletal: Normal range of motion.       There is mild soreness of the left thigh. No palpable deformity. There is full range of motion of the left hip and thigh and left knee. There is swelling of the right lateral malleolus. The dorsalis pedis pulse is 1+ symmetrical. There is good range of motion of the toes. With good capillary refill on the right.  Lymphadenopathy:       Head (right side): No submandibular adenopathy present.       Head (left side): No submandibular adenopathy present.    She has no cervical adenopathy.  Neurological: She is alert and oriented to person, place, and time. She has normal strength. No cranial nerve deficit or sensory deficit.  Skin: Skin is warm and dry.  Psychiatric: She has a normal mood and affect. Her speech is normal.    ED Course  Procedures (including critical care time)  Labs Reviewed - No data to display Dg Ankle Complete Right  05/22/2011  *RADIOLOGY REPORT*  Clinical Data: Fall, twisted ankle  RIGHT ANKLE - COMPLETE 3+ VIEW  Comparison: None.  Findings: There is soft tissue swelling over the lateral malleolus. There is a small avulsion fracture from the tip of the lateral malleolus.  Ankle mortise is intact.  Talar dome is normal.  No medial malleolar fracture.  There is soft tissue swelling anterior to the tibial talar joint.  IMPRESSION: Small avulsion from the most distal aspect of the lateral malleolus.  Ankle mortise is intact.  Original  Report Authenticated By: Genevive Bi, M.D.   Dg Foot Complete Right  05/22/2011  *RADIOLOGY REPORT*  Clinical Data: Fall  RIGHT FOOT COMPLETE - 3+ VIEW  Comparison: None.  Findings: Osteopenia.  Minimal spurring at the posterior calcaneus. No acute fracture and no dislocation.  IMPRESSION: No acute bony pathology.  Chronic change.  Original Report Authenticated By: Donavan Burnet, M.D.   Pulse oximetry 97% on room air. Within normal limits by my interpretation.   Dx: Right ankle avulsion fracture   MDM  I have reviewed nursing notes, vital signs, and all appropriate lab and imaging results for this patient.  Patient to followup with Dr. Devonne Doughty at [facility] orthopedics. Prescription for Norco 7.5 mg given to the patient. Patient has a walker at home, and will keep the right extremity elevated.      Kathie Dike, [place] 05/22/11 1429

## 2011-05-22 NOTE — ED Provider Notes (Signed)
Medical screening examination/treatment/procedure(s) were conducted as a shared visit with non-physician practitioner(s) and myself.  I personally evaluated the patient during the encounter   71 yo F, c/o slip and fall yesterday.  C/o right ankle pain. +tender to palp right lateral maleolar area w/localized edema and ecchymosis, NMS intact right foot, strong pedal pp, LE muscle compartments soft.  No right proximal fibular head tenderness, no knee tenderness.  No deformity, no open wounds.  VSS, NAD, A&O, resps easy.  XR with avulsion fx, splint, walker, f/u Ortho MD.    Laray Anger, DO 05/22/11 1709

## 2011-05-22 NOTE — ED Notes (Signed)
Pt states Fell last night after getting up and feeling like her leg was asleep. Rt ankle has noted purple bruising and swelling .

## 2011-05-22 NOTE — ED Notes (Signed)
Pt fell yesterday and c/o pain in her right lower leg. States that it is swollen and painful.

## 2011-07-10 IMAGING — CR DG CHEST 2V
2 series · 2 of 2 positions shown · non-contrast
Comparison: 09/11/2005

CLINICAL DATA: Occasional shortness of breath, smoker,

CHEST - 2 VIEW

[view not recorded (1 of 2)]
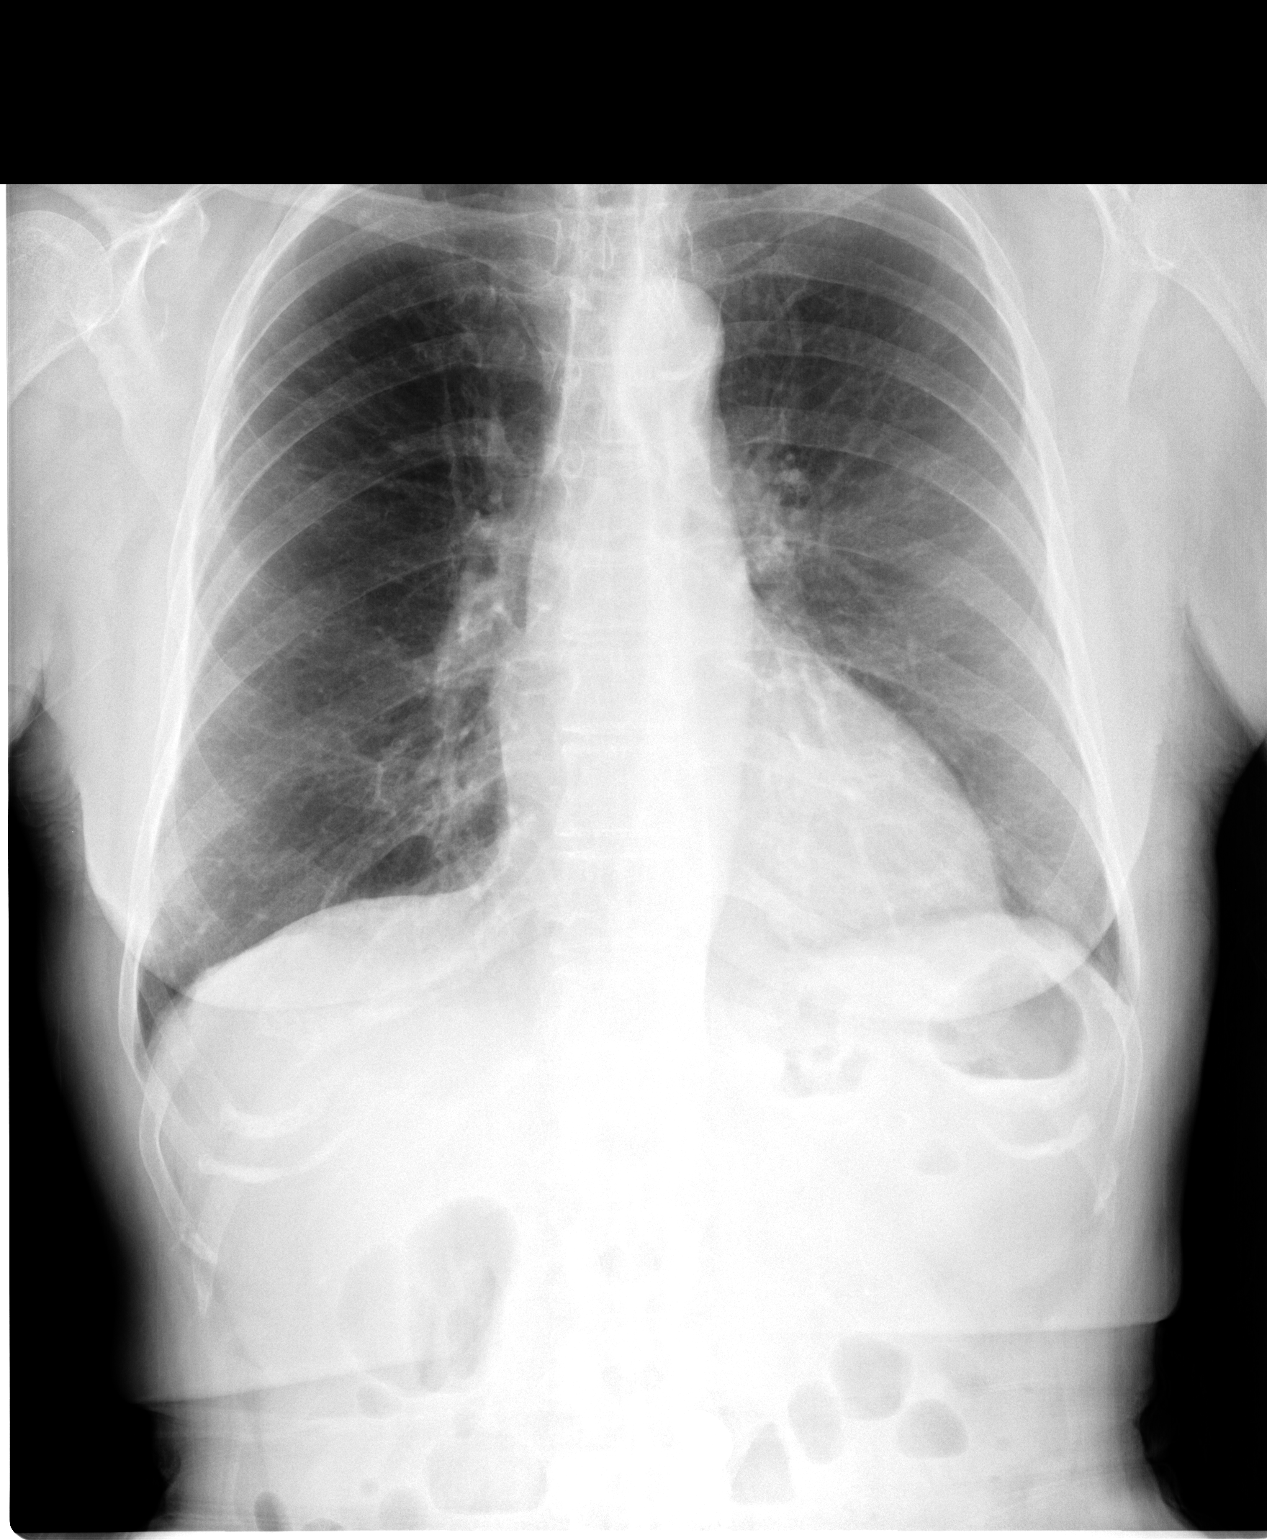

[view not recorded (2 of 2)]
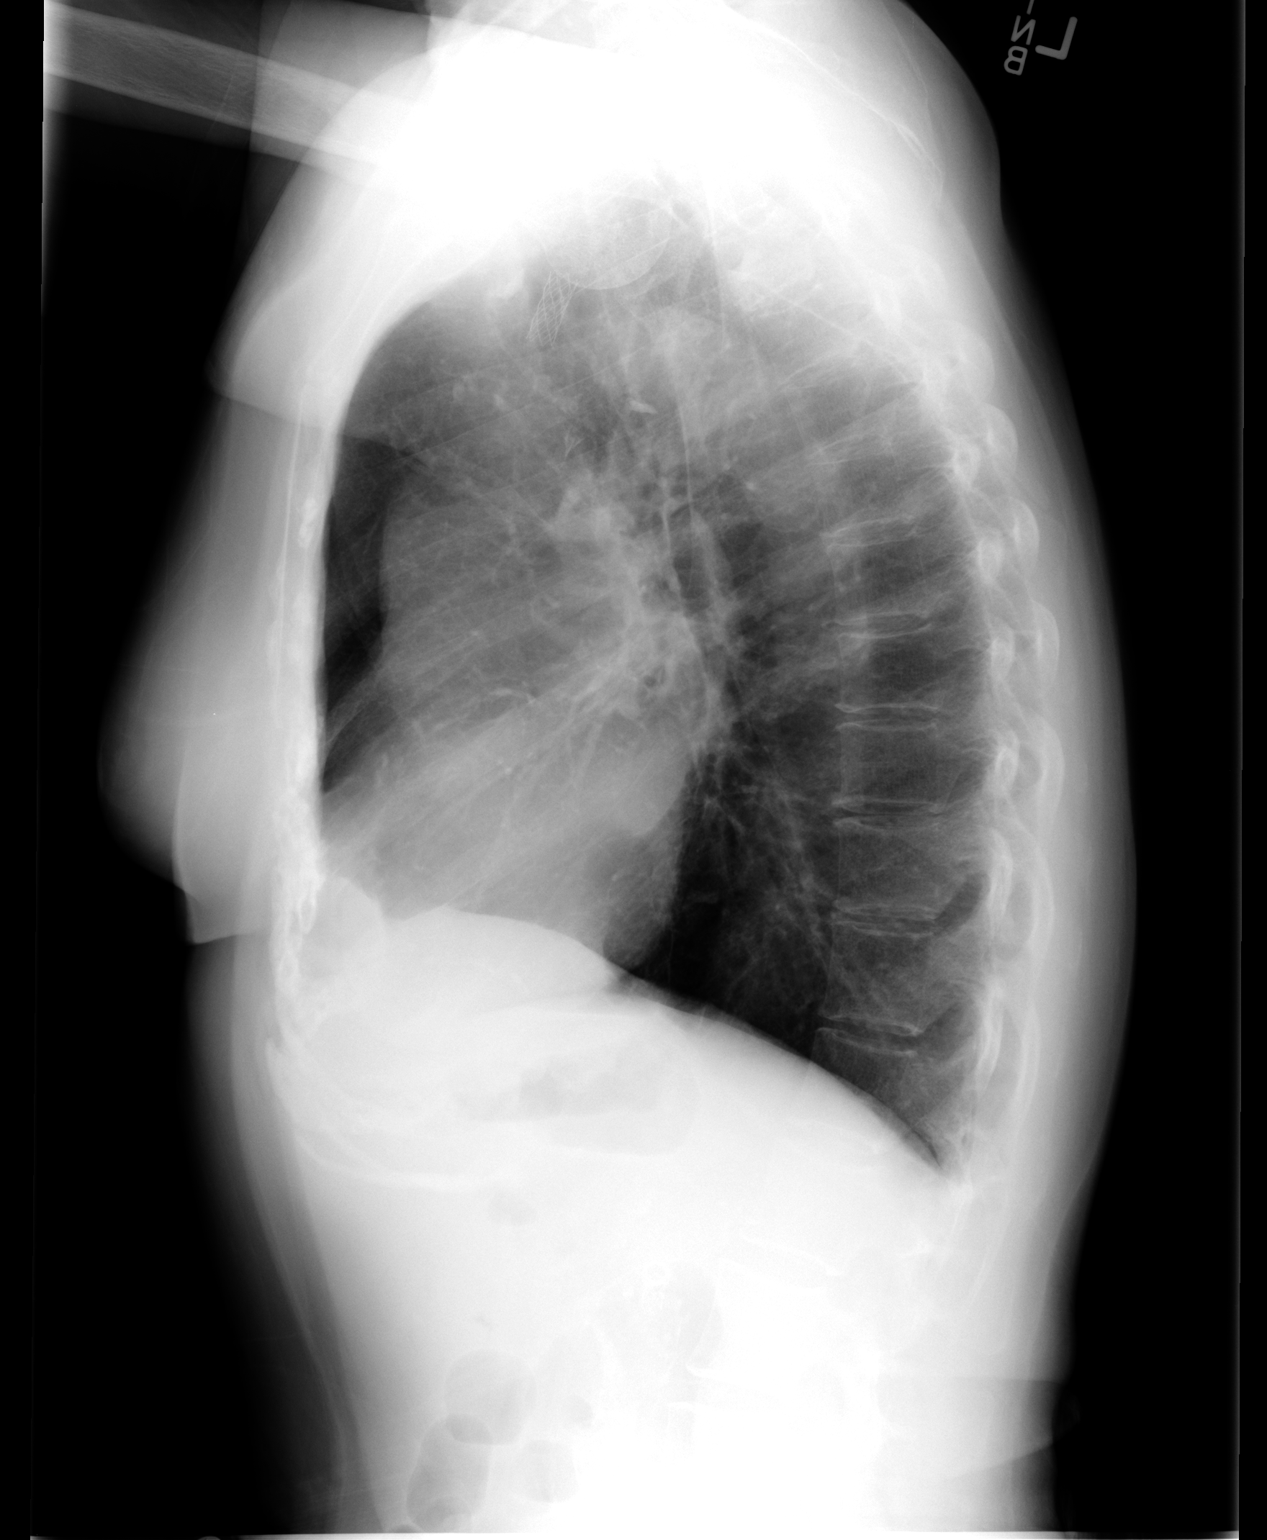

[2 of 2 positions shown; findings below may reference images not displayed]

FINDINGS: Upper normal heart size.
Normal mediastinal contours and pulmonary vascularity.
Atherosclerotic calcification at aortic arch.
Minimal chronic atelectasis or scarring right upper lobe.
Mild chronic bronchitic changes.
No acute infiltrate or effusion.
Bones appear demineralized.
IMPRESSION: Minimal chronic bronchitic changes with atelectasis or scarring
right upper lobe.

## 2011-08-28 ENCOUNTER — Other Ambulatory Visit (HOSPITAL_COMMUNITY): Payer: Self-pay | Admitting: Internal Medicine

## 2011-08-28 DIAGNOSIS — L02619 Cutaneous abscess of unspecified foot: Secondary | ICD-10-CM

## 2011-08-28 DIAGNOSIS — L539 Erythematous condition, unspecified: Secondary | ICD-10-CM

## 2011-09-02 ENCOUNTER — Encounter (HOSPITAL_COMMUNITY)
Admission: RE | Admit: 2011-09-02 | Discharge: 2011-09-02 | Disposition: A | Discharge status: Home / Self Care | Payer: Medicare Other | Source: Ambulatory Visit | Attending: Internal Medicine | Admitting: Internal Medicine

## 2011-09-02 ENCOUNTER — Encounter (HOSPITAL_COMMUNITY): Payer: Self-pay

## 2011-09-02 DIAGNOSIS — L539 Erythematous condition, unspecified: Secondary | ICD-10-CM | POA: Insufficient documentation

## 2011-09-02 DIAGNOSIS — L02619 Cutaneous abscess of unspecified foot: Secondary | ICD-10-CM

## 2011-09-02 DIAGNOSIS — M79609 Pain in unspecified limb: Secondary | ICD-10-CM | POA: Insufficient documentation

## 2011-09-02 MED ORDER — TECHNETIUM TC 99M MEDRONATE IV KIT
25.0000 | PACK | Freq: Once | INTRAVENOUS | Status: AC | PRN
  Administered 2011-09-02: 24 via INTRAVENOUS

## 2011-11-14 DIAGNOSIS — I340 Nonrheumatic mitral (valve) insufficiency: Secondary | ICD-10-CM

## 2011-11-14 DIAGNOSIS — I351 Nonrheumatic aortic (valve) insufficiency: Secondary | ICD-10-CM

## 2011-11-14 HISTORY — DX: Nonrheumatic mitral (valve) insufficiency: I34.0

## 2011-11-14 HISTORY — DX: Nonrheumatic aortic (valve) insufficiency: I35.1

## 2012-06-08 ENCOUNTER — Other Ambulatory Visit (HOSPITAL_COMMUNITY): Payer: Self-pay | Admitting: Cardiovascular Disease

## 2012-06-08 DIAGNOSIS — I10 Essential (primary) hypertension: Secondary | ICD-10-CM

## 2012-06-08 DIAGNOSIS — I351 Nonrheumatic aortic (valve) insufficiency: Secondary | ICD-10-CM

## 2012-06-24 ENCOUNTER — Ambulatory Visit (HOSPITAL_COMMUNITY)
Admission: RE | Admit: 2012-06-24 | Discharge: 2012-06-24 | Disposition: A | Discharge status: Home / Self Care | Payer: Medicare Other | Source: Ambulatory Visit | Attending: Cardiovascular Disease | Admitting: Cardiovascular Disease

## 2012-06-24 DIAGNOSIS — I10 Essential (primary) hypertension: Secondary | ICD-10-CM

## 2012-06-24 NOTE — Progress Notes (Signed)
Carotid Duplex Completed. Sturdivant, Rita D  

## 2012-06-24 NOTE — Progress Notes (Signed)
Renal Duplex Completed. Sturdivant, Rita D  

## 2012-10-26 ENCOUNTER — Other Ambulatory Visit (HOSPITAL_COMMUNITY): Payer: Self-pay | Admitting: Cardiovascular Disease

## 2012-10-26 NOTE — Telephone Encounter (Signed)
Rx was sent to pharmacy electronically. 

## 2012-11-11 ENCOUNTER — Other Ambulatory Visit (HOSPITAL_COMMUNITY): Payer: Self-pay | Admitting: Cardiovascular Disease

## 2012-11-16 ENCOUNTER — Ambulatory Visit (HOSPITAL_COMMUNITY): Payer: Medicare Other

## 2012-11-17 ENCOUNTER — Telehealth (HOSPITAL_COMMUNITY): Payer: Self-pay | Admitting: Cardiovascular Disease

## 2012-11-17 ENCOUNTER — Encounter (HOSPITAL_COMMUNITY): Payer: Medicare Other

## 2012-11-18 ENCOUNTER — Ambulatory Visit (HOSPITAL_COMMUNITY)
Admission: RE | Admit: 2012-11-18 | Discharge: 2012-11-18 | Disposition: A | Discharge status: Home / Self Care | Payer: Medicare Other | Source: Ambulatory Visit | Attending: Cardiovascular Disease | Admitting: Cardiovascular Disease

## 2012-11-18 ENCOUNTER — Ambulatory Visit (HOSPITAL_COMMUNITY): Payer: Medicare Other

## 2012-11-18 DIAGNOSIS — I2789 Other specified pulmonary heart diseases: Secondary | ICD-10-CM | POA: Insufficient documentation

## 2012-11-18 DIAGNOSIS — I079 Rheumatic tricuspid valve disease, unspecified: Secondary | ICD-10-CM | POA: Insufficient documentation

## 2012-11-18 DIAGNOSIS — R011 Cardiac murmur, unspecified: Secondary | ICD-10-CM | POA: Insufficient documentation

## 2012-11-18 DIAGNOSIS — I080 Rheumatic disorders of both mitral and aortic valves: Secondary | ICD-10-CM | POA: Insufficient documentation

## 2012-11-18 DIAGNOSIS — I351 Nonrheumatic aortic (valve) insufficiency: Secondary | ICD-10-CM

## 2012-11-18 DIAGNOSIS — I359 Nonrheumatic aortic valve disorder, unspecified: Secondary | ICD-10-CM

## 2012-11-18 NOTE — Progress Notes (Signed)
2D Echo Performed 11/18/2012    Keyundra Fant, RCS  

## 2012-11-20 ENCOUNTER — Other Ambulatory Visit: Payer: Self-pay | Admitting: Cardiovascular Disease

## 2012-11-24 NOTE — Telephone Encounter (Signed)
Rx was sent to pharmacy electronically. 

## 2012-11-25 ENCOUNTER — Telehealth: Payer: Self-pay | Admitting: Cardiovascular Disease

## 2012-11-25 NOTE — Telephone Encounter (Signed)
Pt was retuning your call. 

## 2012-11-25 NOTE — Telephone Encounter (Signed)
Spoke with patient about echo results and confirmed with her to keep her Sept appt

## 2012-11-27 ENCOUNTER — Telehealth (HOSPITAL_COMMUNITY): Payer: Self-pay | Admitting: Cardiovascular Disease

## 2012-12-25 ENCOUNTER — Other Ambulatory Visit: Payer: Self-pay | Admitting: Cardiovascular Disease

## 2012-12-29 NOTE — Telephone Encounter (Signed)
Rx was sent to pharmacy electronically. 

## 2013-01-07 ENCOUNTER — Encounter: Payer: Self-pay | Admitting: Cardiology

## 2013-01-07 DIAGNOSIS — E7800 Pure hypercholesterolemia, unspecified: Secondary | ICD-10-CM | POA: Insufficient documentation

## 2013-01-07 DIAGNOSIS — I2510 Atherosclerotic heart disease of native coronary artery without angina pectoris: Secondary | ICD-10-CM

## 2013-01-07 DIAGNOSIS — I480 Paroxysmal atrial fibrillation: Secondary | ICD-10-CM | POA: Insufficient documentation

## 2013-01-07 DIAGNOSIS — I351 Nonrheumatic aortic (valve) insufficiency: Secondary | ICD-10-CM

## 2013-01-07 DIAGNOSIS — I739 Peripheral vascular disease, unspecified: Secondary | ICD-10-CM

## 2013-01-07 DIAGNOSIS — K219 Gastro-esophageal reflux disease without esophagitis: Secondary | ICD-10-CM | POA: Insufficient documentation

## 2013-01-07 DIAGNOSIS — I340 Nonrheumatic mitral (valve) insufficiency: Secondary | ICD-10-CM

## 2013-01-07 DIAGNOSIS — I10 Essential (primary) hypertension: Secondary | ICD-10-CM

## 2013-01-11 ENCOUNTER — Ambulatory Visit (INDEPENDENT_AMBULATORY_CARE_PROVIDER_SITE_OTHER): Payer: Medicare Other | Admitting: Cardiovascular Disease

## 2013-01-11 ENCOUNTER — Encounter: Payer: Self-pay | Admitting: Cardiovascular Disease

## 2013-01-11 ENCOUNTER — Ambulatory Visit (HOSPITAL_COMMUNITY)
Admission: RE | Admit: 2013-01-11 | Discharge: 2013-01-11 | Disposition: A | Discharge status: Home / Self Care | Payer: Medicare Other | Source: Ambulatory Visit | Attending: Cardiovascular Disease | Admitting: Cardiovascular Disease

## 2013-01-11 VITALS — BP 140/80 | HR 62 | Ht 60.0 in | Wt 107.0 lb

## 2013-01-11 DIAGNOSIS — Z720 Tobacco use: Secondary | ICD-10-CM

## 2013-01-11 DIAGNOSIS — I739 Peripheral vascular disease, unspecified: Secondary | ICD-10-CM

## 2013-01-11 DIAGNOSIS — I351 Nonrheumatic aortic (valve) insufficiency: Secondary | ICD-10-CM

## 2013-01-11 DIAGNOSIS — I4891 Unspecified atrial fibrillation: Secondary | ICD-10-CM

## 2013-01-11 DIAGNOSIS — I6529 Occlusion and stenosis of unspecified carotid artery: Secondary | ICD-10-CM

## 2013-01-11 DIAGNOSIS — I2510 Atherosclerotic heart disease of native coronary artery without angina pectoris: Secondary | ICD-10-CM

## 2013-01-11 DIAGNOSIS — E7800 Pure hypercholesterolemia, unspecified: Secondary | ICD-10-CM

## 2013-01-11 DIAGNOSIS — F17200 Nicotine dependence, unspecified, uncomplicated: Secondary | ICD-10-CM

## 2013-01-11 DIAGNOSIS — I779 Disorder of arteries and arterioles, unspecified: Secondary | ICD-10-CM | POA: Insufficient documentation

## 2013-01-11 DIAGNOSIS — I10 Essential (primary) hypertension: Secondary | ICD-10-CM

## 2013-01-11 DIAGNOSIS — I359 Nonrheumatic aortic valve disorder, unspecified: Secondary | ICD-10-CM

## 2013-01-11 DIAGNOSIS — I480 Paroxysmal atrial fibrillation: Secondary | ICD-10-CM

## 2013-01-11 NOTE — Assessment & Plan Note (Signed)
She has a known left subclavian artery stent which has been redilated as well as bilateral renal artery stents. She also has severe aortoiliac disease but denies claudication. She has moderate carotid disease as well.

## 2013-01-11 NOTE — Patient Instructions (Addendum)
Your physician wants you to follow-up in: 6 months with an extender and 1 year with Dr Allyson Sabal.  You will receive a reminder letter in the mail two months in advance. If you don't receive a letter, please call our office to schedule the follow-up appointment.   Dr Allyson Sabal has ordered lower extremity and renal dopplers

## 2013-01-11 NOTE — Assessment & Plan Note (Signed)
Maintaining sinus rhythm 

## 2013-01-11 NOTE — Assessment & Plan Note (Signed)
She smokes one pack per day and is recalcitrant to risk factor modification

## 2013-01-11 NOTE — Progress Notes (Signed)
Carotid Duplex Completed. Nillie Bartolotta, BS, RDMS, RVT  

## 2013-01-11 NOTE — Progress Notes (Signed)
01/11/2013 Kara Pacheco   1941-02-02  [id number]  Primary Physician Kara Pacheco., MD Primary Cardiologist: Kara Gess MD Kara Pacheco   HPI: he patient is a 72 year old, thin and frail appearing, married Caucasian female, mother of three, grandmother to seven grandchildren, who I last saw in the office six months ago. She has a history of CAD status post cardiac catheterization by Dr. Lenise Herald on May 04, 2003, showing moderate CAD and moderate to severe aortic insufficiency, as well as MR. Coronary artery bypass grafting and aortic valve replacement was recommended and the patient declined at that time. She also has bilateral subclavian artery disease, status post PTA and stenting of her left subclavian with reintervention in 2011. She has known carotid disease, left greater than right, as well as high grade bilateral renal artery stenosis and bilateral iliac disease as she denies claudication. She does continue to smoke a pack a day. She does not have chest pain. She has paroxysmal atrial fibrillation, currently in sinus rhythm with a low CHADS score. Since I saw her in the office back 05/25/12 she's remained completely asymptomatic.   Current Outpatient Prescriptions  Medication Sig Dispense Refill  . Ascorbic Acid (VITAMIN C) 100 MG tablet Take 100 mg by mouth daily.        Marland Kitchen aspirin 325 MG tablet Take 325 mg by mouth daily.        Marland Kitchen co-enzyme Q-10 50 MG capsule Take 50 mg by mouth daily.      Marland Kitchen diltiazem (CARDIZEM CD) 120 MG 24 hr capsule TAKE ONE CAPSULE BY MOUTH DAILY  30 capsule  1  . DIOVAN 80 MG tablet TAKE ONE (1) TABLET BY MOUTH EVERY DAY  30 tablet  5  . doxazosin (CARDURA) 4 MG tablet Take 4 mg by mouth at bedtime.       Marland Kitchen ezetimibe-simvastatin (VYTORIN) 10-20 MG per tablet Take 1 tablet by mouth at bedtime.        . fish oil-omega-3 fatty acids 1000 MG capsule Take 2 g by mouth daily.        . folic acid (FOLVITE) 1 MG tablet Take 1 mg by mouth  daily. Patient does not take this med as prescribed      . metoprolol tartrate (LOPRESSOR) 25 MG tablet TAKE ONE TABLET TWICE DAILY  180 tablet  2  . vitamin E 100 UNIT capsule Take 100 Units by mouth daily.         No current facility-administered medications for this visit.    Allergies  Allergen Reactions  . Contrast Media [Iodinated Diagnostic Agents]   . Diltiazem   . Heparin   . Miconazole Nitrate   . Penicillins   . Plavix [Clopidogrel Bisulfate]   . Ramipril   . Sulfa Antibiotics     History   Social History  . Marital Status: Married    Spouse Name: N/A    Number of Children: N/A  . Years of Education: N/A   Occupational History  . Not on file.   Social History Main Topics  . Smoking status: Current Every Day Smoker -- 1.00 packs/day for 51 years    Types: Cigarettes  . Smokeless tobacco: Not on file  . Alcohol Use: No  . Drug Use: No  . Sexual Activity:    Other Topics Concern  . Not on file   Social History Narrative  . No narrative on file     Review of Systems: General: negative for chills, fever,  night sweats or weight changes.  Cardiovascular: negative for chest pain, dyspnea on exertion, edema, orthopnea, palpitations, paroxysmal nocturnal dyspnea or shortness of breath Dermatological: negative for rash Respiratory: negative for cough or wheezing Urologic: negative for hematuria Abdominal: negative for nausea, vomiting, diarrhea, bright red blood per rectum, melena, or hematemesis Neurologic: negative for visual changes, syncope, or dizziness All other systems reviewed and are otherwise negative except as noted above.    Blood pressure 140/80, pulse 62, height 5' (1.524 m), weight 107 lb (48.535 kg).  General appearance: alert and no distress Neck: no adenopathy, no JVD, supple, symmetrical, trachea midline, thyroid not enlarged, symmetric, no tenderness/mass/nodules and loud bilateral carotid bruits Lungs: clear to auscultation  bilaterally Heart: aortic insufficiency murmur Extremities: extremities normal, atraumatic, no cyanosis or edema  EKG normal sinus rhythm at 62 with evidence of left hypertrophy with repolarization changes unchanged prior EKGs. She does have septal Q waves.  ASSESSMENT AND PLAN:   Aortic insufficiency 2-D echocardiogram performed 11/24/12 again showed moderate to severe aortic insufficiency with normal LV size and function. She also has mild to moderate mitral regurgitation as well.  PVD (peripheral vascular disease) She has a known left subclavian artery stent which has been redilated as well as bilateral renal artery stents. She also has severe aortoiliac disease but denies claudication. She has moderate carotid disease as well.  Hypertension Well-controlled on current medications  Hypercholesteremia On statin therapy with her most recent lipid profile performed 06/05/12 revealing a total cholesterol 143, LDL 71 and HDL of 48  PAF (paroxysmal atrial fibrillation) Maintaining sinus rhythm  Coronary artery disease Status post coronary artery bypass grafting 05/04/03 revealing 3 vessel CAD. The patient denies chest pain or shortness of breath. She declined coronary artery bypass grafting and aortic valve replacement at that time.  Tobacco abuse She smokes one pack per day and is recalcitrant to risk factor modification      Kara Gess MD [facility] [address], [facility] 01/11/2013 4:46 PM

## 2013-01-11 NOTE — Assessment & Plan Note (Signed)
On statin therapy with her most recent lipid profile performed 06/05/12 revealing a total cholesterol 143, LDL 71 and HDL of 48

## 2013-01-11 NOTE — Assessment & Plan Note (Signed)
2-D echocardiogram performed 11/24/12 again showed moderate to severe aortic insufficiency with normal LV size and function. She also has mild to moderate mitral regurgitation as well.

## 2013-01-11 NOTE — Assessment & Plan Note (Signed)
Status post coronary artery bypass grafting 05/04/03 revealing 3 vessel CAD. The patient denies chest pain or shortness of breath. She declined coronary artery bypass grafting and aortic valve replacement at that time.

## 2013-01-11 NOTE — Assessment & Plan Note (Signed)
Well-controlled on current medications 

## 2013-01-15 IMAGING — CR DG ANKLE COMPLETE 3+V*R*
3 series · 3 of 3 positions shown · non-contrast
Comparison: None.

CLINICAL DATA: Fall, twisted ankle

RIGHT ANKLE - COMPLETE 3+ VIEW

[view not recorded (1 of 3)]
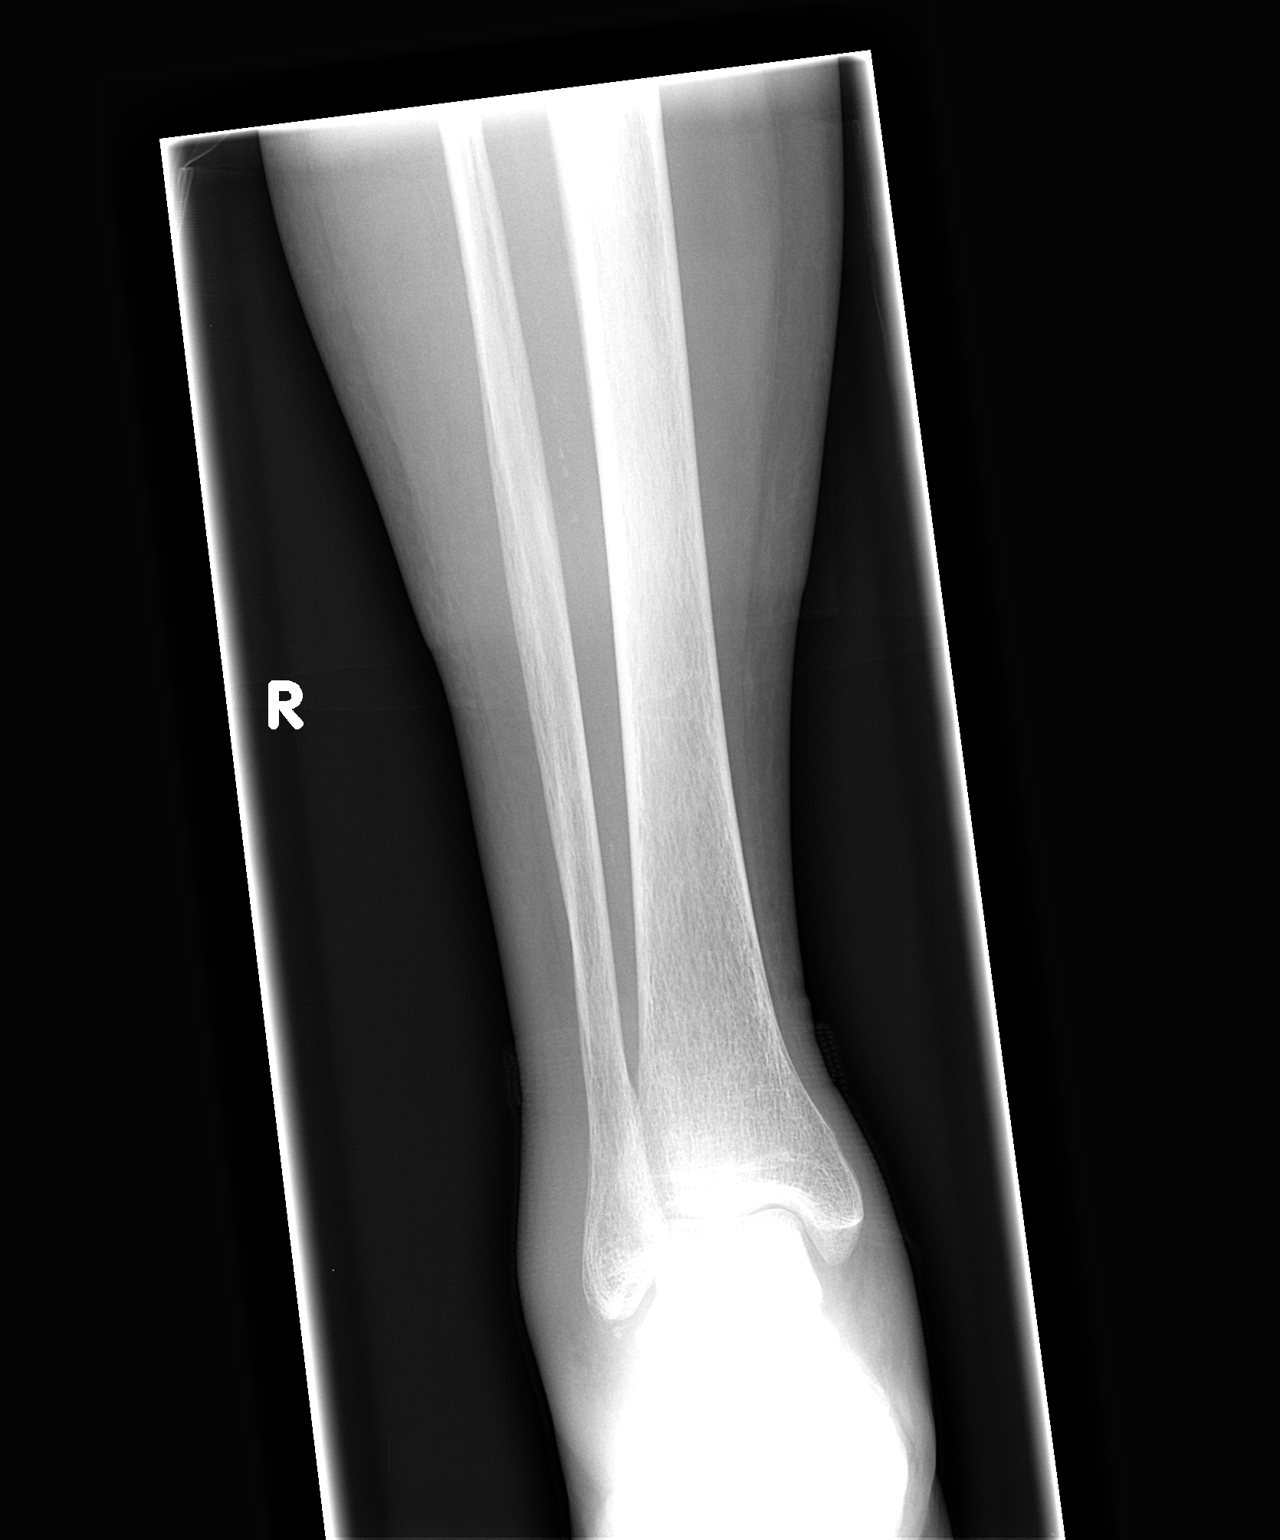

[view not recorded (2 of 3)]
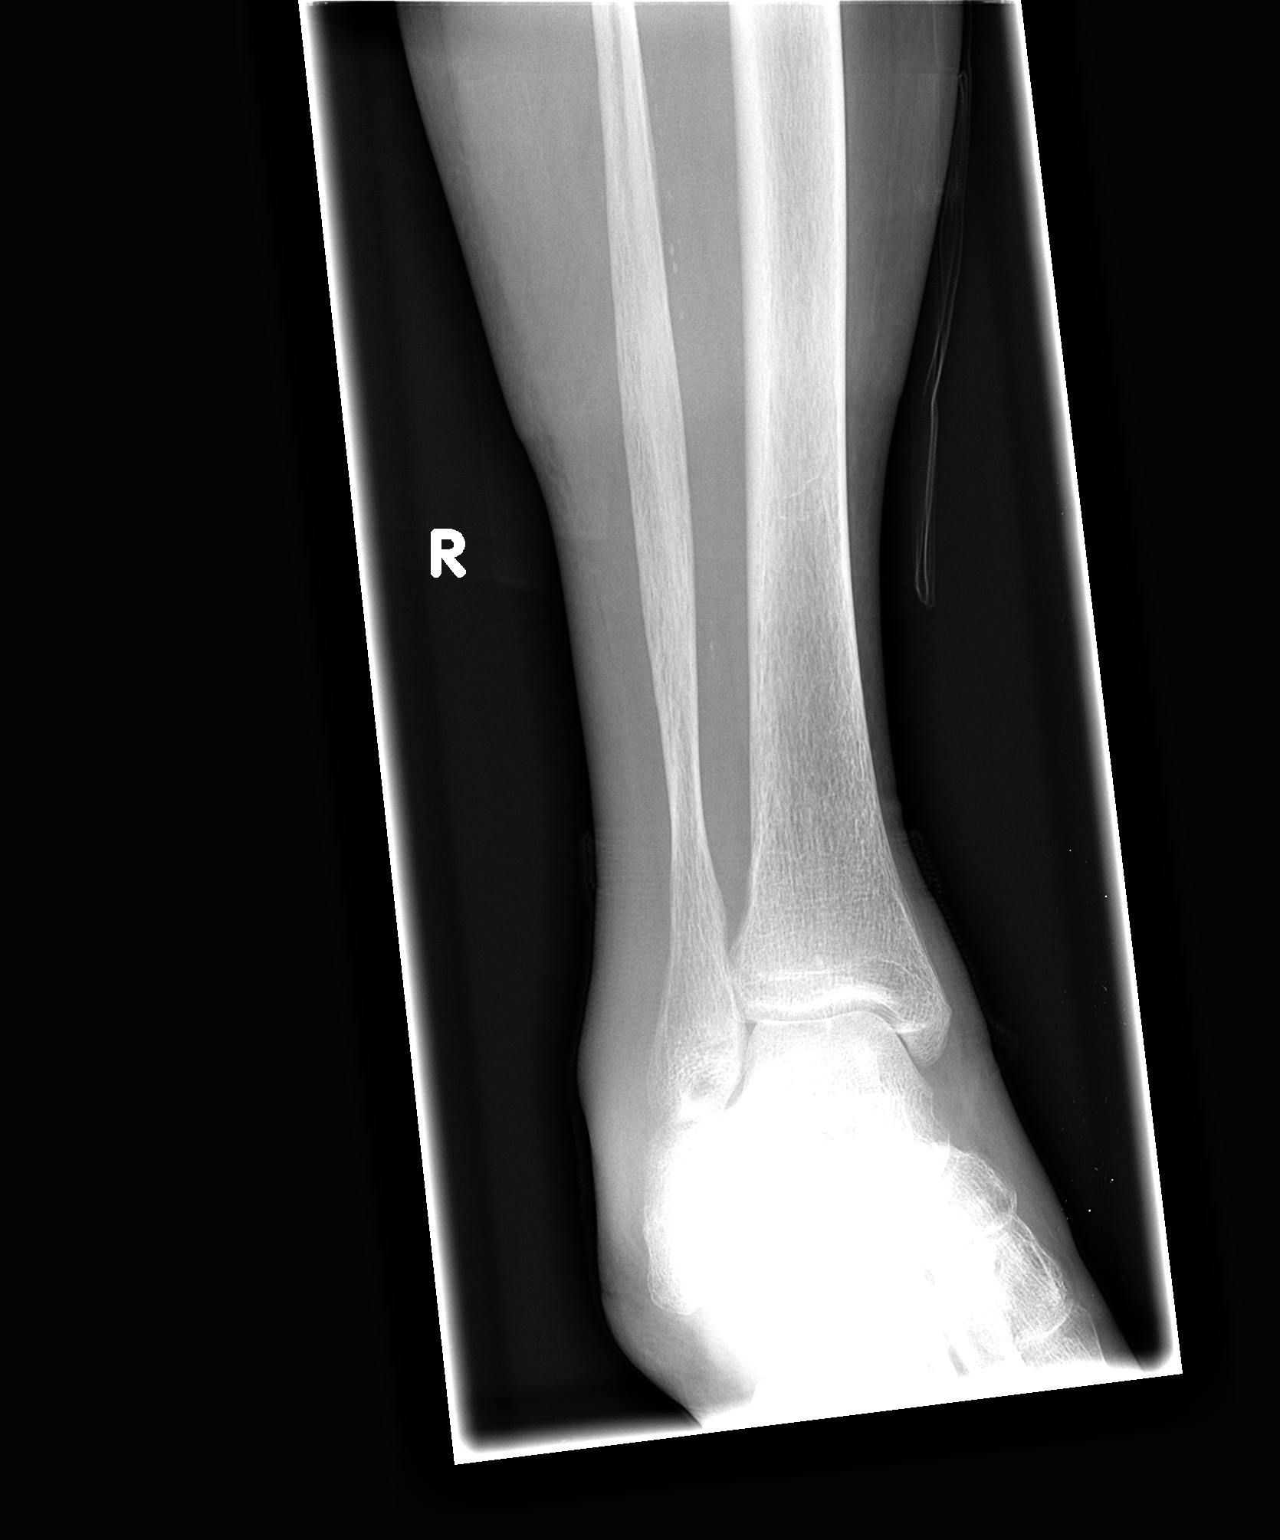

[view not recorded (3 of 3)]
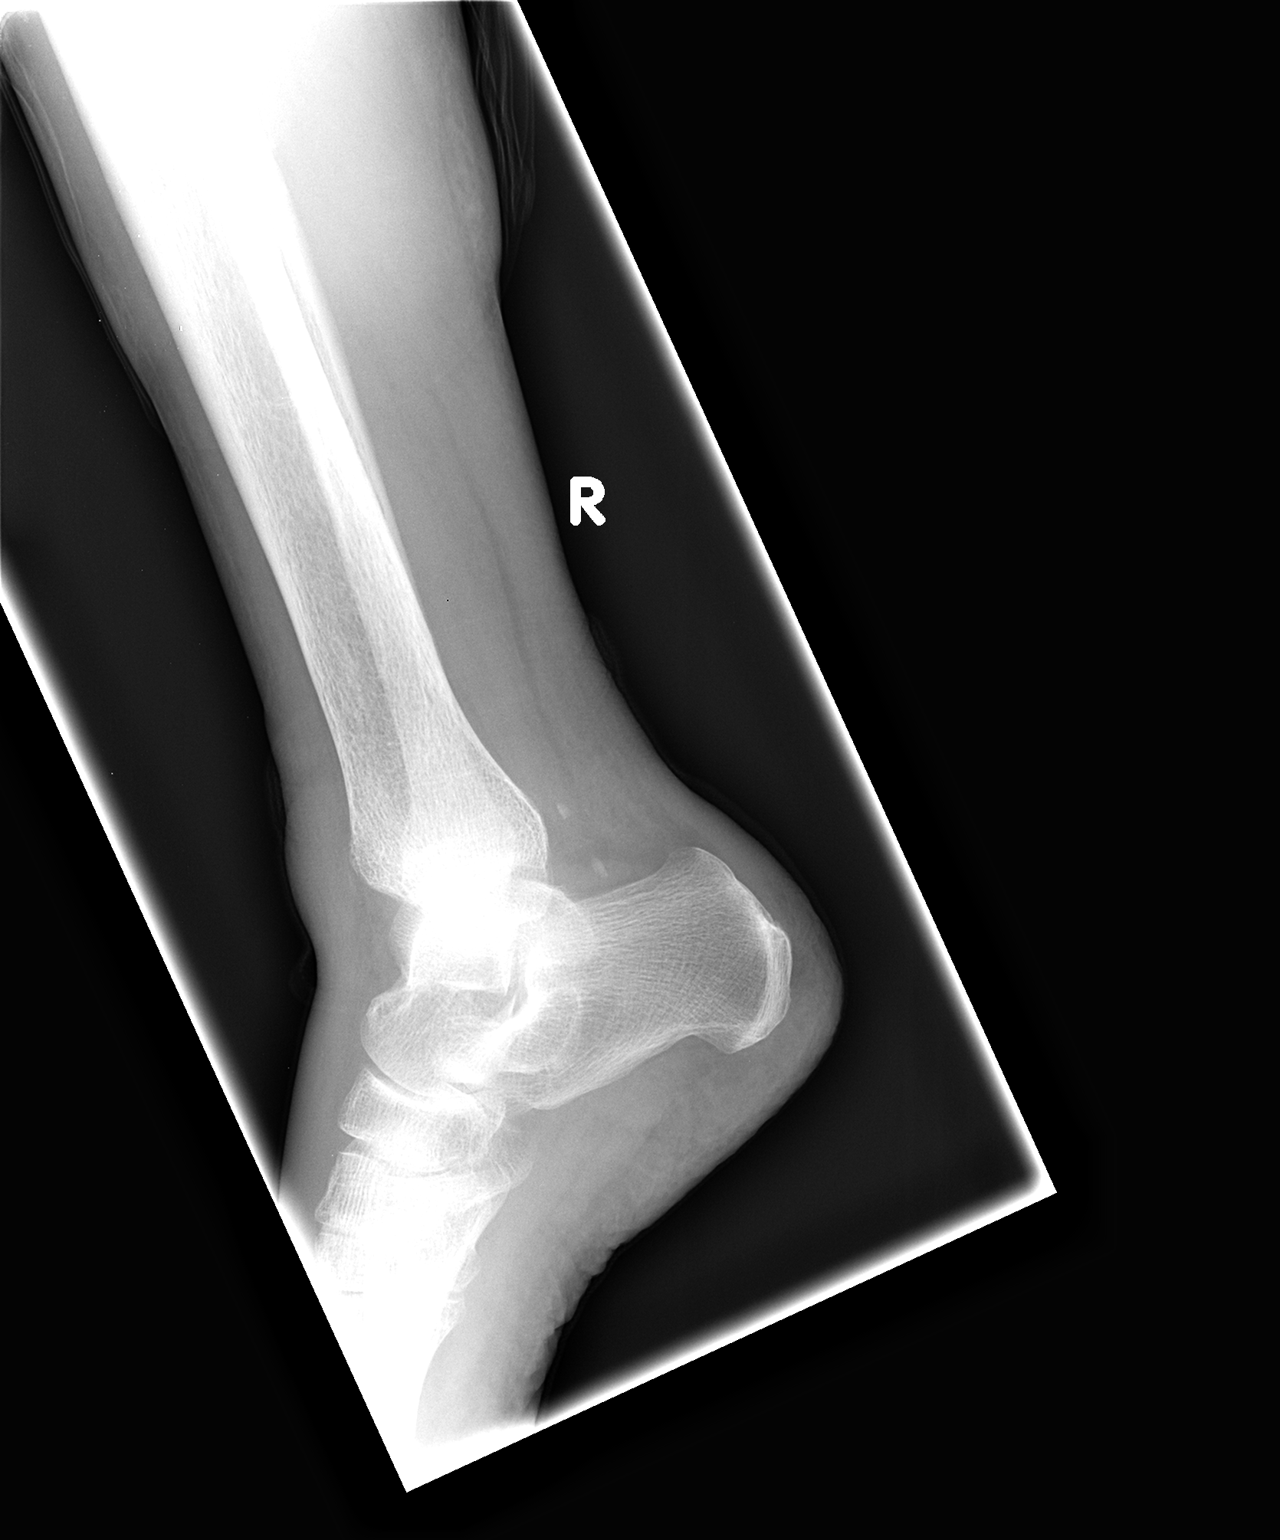

[3 of 3 positions shown; findings below may reference images not displayed]

FINDINGS: There is soft tissue swelling over the lateral malleolus.
There is a small avulsion fracture from the tip of the lateral
malleolus.  Ankle mortise is intact.  Talar dome is normal.  No
medial malleolar fracture.  There is soft tissue swelling anterior
to the tibial talar joint.
IMPRESSION: Small avulsion from the most distal aspect of the lateral
malleolus.

Ankle mortise is intact.

## 2013-01-20 ENCOUNTER — Encounter: Payer: Self-pay | Admitting: *Deleted

## 2013-01-22 ENCOUNTER — Telehealth (HOSPITAL_COMMUNITY): Payer: Self-pay | Admitting: *Deleted

## 2013-01-25 ENCOUNTER — Other Ambulatory Visit: Payer: Self-pay | Admitting: Cardiovascular Disease

## 2013-01-25 NOTE — Telephone Encounter (Signed)
Rx was sent to pharmacy electronically. 

## 2013-02-03 ENCOUNTER — Ambulatory Visit (HOSPITAL_COMMUNITY)
Admission: RE | Admit: 2013-02-03 | Discharge: 2013-02-03 | Disposition: A | Discharge status: Home / Self Care | Payer: Medicare Other | Source: Ambulatory Visit | Attending: Cardiovascular Disease | Admitting: Cardiovascular Disease

## 2013-02-03 ENCOUNTER — Ambulatory Visit (HOSPITAL_BASED_OUTPATIENT_CLINIC_OR_DEPARTMENT_OTHER)
Admission: RE | Admit: 2013-02-03 | Discharge: 2013-02-03 | Disposition: A | Discharge status: Home / Self Care | Payer: Medicare Other | Source: Ambulatory Visit | Attending: Cardiovascular Disease | Admitting: Cardiovascular Disease

## 2013-02-03 DIAGNOSIS — I739 Peripheral vascular disease, unspecified: Secondary | ICD-10-CM

## 2013-02-03 DIAGNOSIS — I70219 Atherosclerosis of native arteries of extremities with intermittent claudication, unspecified extremity: Secondary | ICD-10-CM

## 2013-02-03 DIAGNOSIS — I701 Atherosclerosis of renal artery: Secondary | ICD-10-CM

## 2013-02-03 DIAGNOSIS — I10 Essential (primary) hypertension: Secondary | ICD-10-CM

## 2013-02-03 NOTE — Progress Notes (Signed)
Arterial Duplex Lower Ext. Completed. Daylan Juhnke, BS, RDMS, RVT  

## 2013-02-03 NOTE — Progress Notes (Signed)
Renal Duplex Completed. Brogan Martis, BS, RDMS, RVT  

## 2013-02-09 ENCOUNTER — Encounter (HOSPITAL_COMMUNITY): Payer: Medicare Other

## 2013-02-09 ENCOUNTER — Telehealth: Payer: Self-pay | Admitting: *Deleted

## 2013-02-09 DIAGNOSIS — I701 Atherosclerosis of renal artery: Secondary | ICD-10-CM

## 2013-02-09 NOTE — Telephone Encounter (Signed)
Message copied by Marella Bile on Tue Feb 09, 2013  2:47 PM ------      Message from: Runell Gess      Created: Sat Feb 06, 2013  4:51 PM       No change from prior study. Repeat in 12 months. ------

## 2013-02-09 NOTE — Telephone Encounter (Signed)
Order placed for repeat renal doppler in 1 year 

## 2013-02-18 ENCOUNTER — Ambulatory Visit (INDEPENDENT_AMBULATORY_CARE_PROVIDER_SITE_OTHER): Payer: Medicare Other | Admitting: Cardiovascular Disease

## 2013-02-18 ENCOUNTER — Encounter: Payer: Self-pay | Admitting: Cardiovascular Disease

## 2013-02-18 VITALS — BP 176/62 | HR 64 | Ht 60.0 in | Wt 111.0 lb

## 2013-02-18 DIAGNOSIS — I739 Peripheral vascular disease, unspecified: Secondary | ICD-10-CM

## 2013-02-18 NOTE — Progress Notes (Signed)
Kara Pacheco returns today for followup of her lower gemmae Doppler studies and carotid Dopplers. Her carotid Dopplers revealed mild steady progression of disease with moderately severe left and moderate right internal carotid artery stenosis. Her lower gemmae Dopplers suggest severe aortic iliac disease with a right ABI 0.9 and a left of 0.74. Her left hand about 3 years ago. She does have lifestyle limiting claudication. I've offered her the opportunity for angiography and potential [place] his intervention for her at this point she wishes to consider this. I will see her back in 6 months  Runell Gess, M.D., [facility] [address] [place], [place]  [address]  [phone] 02/18/2013 12:21 PM

## 2013-02-18 NOTE — Patient Instructions (Addendum)
You need to see Dr Allyson Sabal in 6 months.  Give me a call if you want to proceed with the lower extremity angiogram. Kara Pacheco [phone])

## 2013-02-18 NOTE — Assessment & Plan Note (Signed)
Patient has a history of severe peripheral arterial disease involving her carotids, subclavian, renal arteries and aortoiliac. Her last intervention performed 8//11 revealed severe calcific aortoiliac disease. She is symptomatic. Her Dopplers have remained stable but she does get claudication with minimal exertion. We talked about angiography and potential percutaneous revascularization however at this juncture she wishes to continue to think about this.

## 2013-03-29 ENCOUNTER — Other Ambulatory Visit: Payer: Self-pay | Admitting: Cardiovascular Disease

## 2013-03-29 NOTE — Telephone Encounter (Signed)
Rx was sent to pharmacy electronically. 

## 2013-04-17 ENCOUNTER — Encounter (HOSPITAL_COMMUNITY): Payer: Self-pay | Admitting: Emergency Medicine

## 2013-04-17 ENCOUNTER — Emergency Department (HOSPITAL_COMMUNITY): Payer: Medicare Other

## 2013-04-17 ENCOUNTER — Emergency Department (HOSPITAL_COMMUNITY)
Admission: EM | Admit: 2013-04-17 | Discharge: 2013-05-16 | Disposition: E | Payer: Medicare Other | Attending: Emergency Medicine | Admitting: Emergency Medicine

## 2013-04-17 DIAGNOSIS — Z8639 Personal history of other endocrine, nutritional and metabolic disease: Secondary | ICD-10-CM | POA: Insufficient documentation

## 2013-04-17 DIAGNOSIS — Z79899 Other long term (current) drug therapy: Secondary | ICD-10-CM | POA: Insufficient documentation

## 2013-04-17 DIAGNOSIS — J4489 Other specified chronic obstructive pulmonary disease: Secondary | ICD-10-CM | POA: Insufficient documentation

## 2013-04-17 DIAGNOSIS — Z7982 Long term (current) use of aspirin: Secondary | ICD-10-CM | POA: Insufficient documentation

## 2013-04-17 DIAGNOSIS — I469 Cardiac arrest, cause unspecified: Secondary | ICD-10-CM | POA: Insufficient documentation

## 2013-04-17 DIAGNOSIS — Z8719 Personal history of other diseases of the digestive system: Secondary | ICD-10-CM | POA: Insufficient documentation

## 2013-04-17 DIAGNOSIS — I2510 Atherosclerotic heart disease of native coronary artery without angina pectoris: Secondary | ICD-10-CM | POA: Insufficient documentation

## 2013-04-17 DIAGNOSIS — I4891 Unspecified atrial fibrillation: Secondary | ICD-10-CM | POA: Insufficient documentation

## 2013-04-17 DIAGNOSIS — J449 Chronic obstructive pulmonary disease, unspecified: Secondary | ICD-10-CM | POA: Insufficient documentation

## 2013-04-17 DIAGNOSIS — I2119 ST elevation (STEMI) myocardial infarction involving other coronary artery of inferior wall: Secondary | ICD-10-CM | POA: Insufficient documentation

## 2013-04-17 DIAGNOSIS — Z862 Personal history of diseases of the blood and blood-forming organs and certain disorders involving the immune mechanism: Secondary | ICD-10-CM | POA: Insufficient documentation

## 2013-04-17 DIAGNOSIS — I213 ST elevation (STEMI) myocardial infarction of unspecified site: Secondary | ICD-10-CM

## 2013-04-17 DIAGNOSIS — F17200 Nicotine dependence, unspecified, uncomplicated: Secondary | ICD-10-CM | POA: Insufficient documentation

## 2013-04-17 DIAGNOSIS — Z880 Allergy status to penicillin: Secondary | ICD-10-CM | POA: Insufficient documentation

## 2013-04-17 DIAGNOSIS — Z95818 Presence of other cardiac implants and grafts: Secondary | ICD-10-CM | POA: Insufficient documentation

## 2013-04-17 DIAGNOSIS — I10 Essential (primary) hypertension: Secondary | ICD-10-CM | POA: Insufficient documentation

## 2013-04-17 LAB — COMPREHENSIVE METABOLIC PANEL
ALBUMIN: 2.9 g/dL — AB (ref 3.5–5.2)
ALK PHOS: 43 U/L (ref 39–117)
ALT: 638 U/L — AB (ref 0–35)
AST: 1011 U/L — ABNORMAL HIGH (ref 0–37)
BILIRUBIN TOTAL: 1.4 mg/dL — AB (ref 0.3–1.2)
BUN: 45 mg/dL — ABNORMAL HIGH (ref 6–23)
CHLORIDE: 100 meq/L (ref 96–112)
CO2: 9 mEq/L — CL (ref 19–32)
Calcium: 9.4 mg/dL (ref 8.4–10.5)
Creatinine, Ser: 2.06 mg/dL — ABNORMAL HIGH (ref 0.50–1.10)
GFR calc Af Amer: 27 mL/min — ABNORMAL LOW (ref >90)
GFR calc non Af Amer: 23 mL/min — ABNORMAL LOW (ref >90)
GLUCOSE: 219 mg/dL — AB (ref 70–99)
Potassium: 5.2 mEq/L (ref 3.7–5.3)
SODIUM: 141 meq/L (ref 137–147)
TOTAL PROTEIN: 6.2 g/dL (ref 6.0–8.3)

## 2013-04-17 LAB — CBC WITH DIFFERENTIAL/PLATELET
Basophils Absolute: 0.1 10*3/uL (ref 0.0–0.1)
Basophils Relative: 0 % (ref 0–1)
EOS ABS: 0 10*3/uL (ref 0.0–0.7)
Eosinophils Relative: 0 % (ref 0–5)
HCT: 47.6 % — ABNORMAL HIGH (ref 36.0–46.0)
HEMOGLOBIN: 13.9 g/dL (ref 12.0–15.0)
Lymphocytes Relative: 34 % (ref 12–46)
Lymphs Abs: 5 10*3/uL — ABNORMAL HIGH (ref 0.7–4.0)
MCH: 31 pg (ref 26.0–34.0)
MCHC: 29.2 g/dL — ABNORMAL LOW (ref 30.0–36.0)
MCV: 106 fL — ABNORMAL HIGH (ref 78.0–100.0)
Monocytes Absolute: 1.7 10*3/uL — ABNORMAL HIGH (ref 0.1–1.0)
Monocytes Relative: 12 % (ref 3–12)
NEUTROS PCT: 54 % (ref 43–77)
Neutro Abs: 8.2 10*3/uL — ABNORMAL HIGH (ref 1.7–7.7)
PLATELETS: 245 10*3/uL (ref 150–400)
RBC: 4.49 MIL/uL (ref 3.87–5.11)
RDW: 15.9 % — ABNORMAL HIGH (ref 11.5–15.5)
WBC: 15 10*3/uL — AB (ref 4.0–10.5)

## 2013-04-17 LAB — LACTIC ACID, PLASMA: Lactic Acid, Venous: 16.4 mmol/L — ABNORMAL HIGH (ref 0.5–2.2)

## 2013-04-17 LAB — GLUCOSE, CAPILLARY: Glucose-Capillary: 162 mg/dL — ABNORMAL HIGH (ref 70–99)

## 2013-04-20 MED FILL — Medication: Qty: 1 | Status: AC

## 2013-05-16 NOTE — ED Notes (Addendum)
Pulse check, moderate right femoral pulse and right carotid palpated. Given verbal order to start Epinephrine Drip at and for RT to intubate pt.Size 7 tube,20cm at the gumline placed by RT at 1851. Epinephrine drip started.Attempted to start second IV, several sites infiltrated at insertion.Pt husband at bedside and involved in plan of care. Pulses re-assessed. No pulse found CPR resumed at 1855.

## 2013-05-16 NOTE — ED Provider Notes (Signed)
CSN: [id number]     Arrival date & time 25-Apr-2013  1826 History   First MD Initiated Contact with Patient 2013/04/25 1831     Chief Complaint  Patient presents with  . Chest Pain  level 5 caveat due to cardiac arrest (Consider location/radiation/quality/duration/timing/severity/associated sxs/prior Treatment) Patient is a 73 y.o. female presenting with chest pain. The history is provided by the patient and a relative.  Chest Pain  patient came in reportedly complaining of left-sided chest pain to her left or the last few days. She would not commit the ER previously despite family's urging 2. She apparently has been vomiting the last day or 2. She became less responsive at home today weaker and was brought in to the ER. While she was being brought back to the room she became unresponsive and lost her pulse. CPR was started.  Past Medical History  Diagnosis Date  . Hypertension   . Hypercholesteremia   . Coronary artery disease Jan 2005    pt declined CABG, low risk Myoview 4/11  . Angina   . GERD (gastroesophageal reflux disease)   . Aortic insufficiency 8/13    moderate to severe  . Mitral regurgitation 8/13    mild to moderate  . PVD (peripheral vascular disease) '00, '05, '2011    renal artery and SCA PTA  . PAF (paroxysmal atrial fibrillation)     remote PAF  . Carotid artery disease   . COPD (chronic obstructive pulmonary disease)    Past Surgical History  Procedure Laterality Date  . Abdominal hysterectomy    . Cardiac catheterization  Jan '05    declined CABG  . Right femoral artery repair  Nov 2000    after RA/LSCA PTA  . Colonoscopy  02/27/2011    Procedure: COLONOSCOPY;  Surgeon: Malissa Hippo, MD;  Location: [facility];  Service: Endoscopy;  Laterality: N/A;  7:30 am  . Renal artery stent  Nov '00    Rt renal artery  . Subclavian artery stent  Nov '00    Lt SCA  . Renal artery stent  Jan '01    Lt renal artery  . Renal artery angioplasty  March '05    Lt  renal ISR  . Subclavian artery stent  March '05    Lt SCA ISR  . Renal artery angioplasty  Aug 2011    Lt renal ISR  . Subclavian artery stent  Aug 2011    Lt SCA ISR   Family History  Problem Relation Age of Onset  . Colon cancer Paternal Uncle    History  Substance Use Topics  . Smoking status: Current Every Day Smoker -- 1.00 packs/day for 51 years    Types: Cigarettes  . Smokeless tobacco: Not on file  . Alcohol Use: No   OB History   Grav Para Term Preterm Abortions TAB SAB Ect Mult Living                 Review of Systems  Unable to perform ROS Cardiovascular: Positive for chest pain.    Allergies  Contrast media; Diltiazem; Heparin; Miconazole nitrate; Penicillins; Plavix; Ramipril; and Sulfa antibiotics  Home Medications   Current Outpatient Rx  Name  Route  Sig  Dispense  Refill  . Ascorbic Acid (VITAMIN C) 100 MG tablet   Oral   Take 100 mg by mouth daily.           Marland Kitchen aspirin 325 MG tablet   Oral   Take  325 mg by mouth daily.           Marland Kitchen co-enzyme Q-10 50 MG capsule   Oral   Take 50 mg by mouth daily.         Marland Kitchen diltiazem (CARDIZEM CD) 120 MG 24 hr capsule      TAKE ONE CAPSULE BY MOUTH DAILY   30 capsule   11   . DIOVAN 80 MG tablet      TAKE ONE (1) TABLET BY MOUTH EVERY DAY   30 tablet   5     Dispense as written.   Marland Kitchen doxazosin (CARDURA) 4 MG tablet   Oral   Take 4 mg by mouth at bedtime.          Marland Kitchen ezetimibe-simvastatin (VYTORIN) 10-20 MG per tablet   Oral   Take 1 tablet by mouth at bedtime.           . fish oil-omega-3 fatty acids 1000 MG capsule   Oral   Take 2 g by mouth daily.           . folic acid (FOLVITE) 1 MG tablet      TAKE ONE (1) TABLET EACH DAY   30 tablet   5   . metoprolol tartrate (LOPRESSOR) 25 MG tablet      TAKE ONE TABLET TWICE DAILY   180 tablet   2   . vitamin E 100 UNIT capsule   Oral   Take 100 Units by mouth daily.            There were no vitals taken for this  visit. Physical Exam  Constitutional: She appears well-developed.  HENT:  Head: Normocephalic.  Eyes:  Pupils midpoint.  Cardiovascular:  Pulseless  Pulmonary/Chest:  Equal, harsh breath sounds with bagging  Abdominal:  Mild upper abdominal distention  Neurological:  No response to pain. Some spontaneous breathing.  Skin: There is pallor.    ED Course  Procedures (including critical care time) Labs Review Labs Reviewed  CBC WITH DIFFERENTIAL - Abnormal; Notable for the following:    WBC 15.0 (*)    HCT 47.6 (*)    MCV 106.0 (*)    MCHC 29.2 (*)    RDW 15.9 (*)    Neutro Abs 8.2 (*)    Lymphs Abs 5.0 (*)    Monocytes Absolute 1.7 (*)    All other components within normal limits  COMPREHENSIVE METABOLIC PANEL - Abnormal; Notable for the following:    CO2 9 (*)    Glucose, Bld 219 (*)    BUN 45 (*)    Creatinine, Ser 2.06 (*)    Albumin 2.9 (*)    AST 1011 (*)    ALT 638 (*)    Total Bilirubin 1.4 (*)    GFR calc non Af Amer 23 (*)    GFR calc Af Amer 27 (*)    All other components within normal limits  LACTIC ACID, PLASMA - Abnormal; Notable for the following:    Lactic Acid, Venous 16.4 (*)    All other components within normal limits  GLUCOSE, CAPILLARY - Abnormal; Notable for the following:    Glucose-Capillary 162 (*)    All other components within normal limits   Imaging Review Dg Chest Port 1 View  13-May-2013   CLINICAL DATA:  Left-sided chest and arm pain and numbness.  EXAM: PORTABLE CHEST - 1 VIEW  COMPARISON:  Chest x-ray 11/13/2009.  FINDINGS: An endotracheal tube is in place with  tip 2.4 cm above the carina. Vascular stent projecting medially superior to the aortic arch. Transcutaneous defibrillator pad projecting over that lower central chest. There is cephalization of the pulmonary vasculature, indistinctness of the interstitial markings, and patchy airspace disease throughout the lungs bilaterally suggestive of moderate pulmonary edema. Mild  cardiomegaly. No definite pleural effusions. No pneumothorax. Upper mediastinal contours are within normal limits. Atherosclerosis in the thoracic aorta.  IMPRESSION: 1. Support apparatus, as above. 2. Appearance of the chest suggests congestive heart failure, as above.   Electronically Signed   By: Trudie Reed M.D.   On: 04/18/2013 19:20    EKG Interpretation    Date/Time:  Saturday April 17 2013 18:44:13 EST Ventricular Rate:  102 PR Interval:    QRS Duration: 170 QT Interval:  410 QTC Calculation: 534 R Axis:   -9 Text Interpretation:  Wide QRS rhythm with occasional Premature ventricular complexes inferior STEMI Right bundle branch block Inferior infarct , age undetermined Abnormal ECG When compared with ECG of 17-Nov-2009 05:42, Wide QRS rhythm has replaced Sinus rhythm Vent. rate has increased BY  40 BPM Confirmed by Rubin Payor  MD, Leslieann Whisman ([id number]) on 04/16/2013 9:18:32 PM           Date: 04/22/2013 1909  Rate: 78  Rhythm: indeterminate  QRS Axis: normal  Intervals: Wide QRS  ST/T Wave abnormalities: Diffuse ST changes. Elevation inferiorly anteriorly and laterally.  Conduction Disutrbances:right bundle branch block  Narrative Interpretation:   Old EKG Reviewed: changes noted    MDM   1. Cardiac arrest   2. STEMI (ST elevation myocardial infarction)    Patient came in complaining of chest pain and quickly went a cardiac arrest. Had return of vitals with multiple doses of epinephrine, a dose of atropine due to bradycardic PEA, fluid boluses, calcium, bicarbonate, and eventually an epinephrine drip. Patient was intubated by me. Initial rhythm showed bradycardic PEA. With CPR and ACLS patient had a initially pulseless narrow rhythm that eventually developed a weak pulse. Initial EKG showed a inferior STEMI. I discussed with Dr. [place], interventionalists on call, but patient did not appear to be stable for transfer down to [facility] at this time. Further stabilization was  attempted in the ED, with CPR required 2 or 3 times. Patient eventually lost a pulse on the epidural an EKG showed worsening ST elevation there got from inferior to diffuse. At this point she does not appear to be recoverable. Laboratory showed lactate of 16 and a bicarbonate of 9. Family had been present for CPR and after pulse was lost the epinephrine drip was stopped and the endotracheal tube was removed. Time of death 48. Donor services notified. Patient's PCP was notified and a message was left on his own. This is likely ACS over the last couple days.  INTUBATION Performed by: Billee Cashing  Required items: required blood products, implants, devices, and special equipment available Patient identity confirmed: provided demographic data and hospital-assigned identification number Time out: Immediately prior to procedure a "time out" was called to verify the correct patient, procedure, equipment, support staff and site/side marked as required.  Indications: cardiac arrest  Intubation method: Glidescope Laryngoscopy   Preoxygenation: BVM  Tube Size: 7.5 cuffed  Post-procedure assessment: chest rise and ETCO2 monitor Breath sounds: equal and absent over the epigastrium Tube secured with: ETT holder Chest x-ray interpreted by radiologist and me.  Chest x-ray findings: endotracheal tube in appropriate position  Patient tolerated the procedure well with no immediate complications.  Cardiopulmonary Resuscitation (CPR)  Procedure Note Directed/Performed by: Billee CashingPICKERING,Wassim Kirksey R. I personally directed ancillary staff and/or performed CPR in an effort to regain return of spontaneous circulation and to maintain cardiac, neuro and systemic perfusion.   CRITICAL CARE Performed by: Billee CashingPICKERING,Javon Hupfer R. Total critical care time: 30 Critical care time was exclusive of separately billable procedures and treating other patients. Critical care was necessary to treat or prevent imminent or  life-threatening deterioration. Critical care was time spent personally by me on the following activities: development of treatment plan with patient and/or surrogate as well as nursing, discussions with consultants, evaluation of patient's response to treatment, examination of patient, obtaining history from patient or surrogate, ordering and performing treatments and interventions, ordering and review of laboratory studies, ordering and review of radiographic studies, pulse oximetry and re-evaluation of patient's condition.  Juliet RudeNathan R. Rubin PayorPickering, MD 05/09/2013 2121

## 2013-05-16 NOTE — ED Notes (Addendum)
Pt extubated at 1928 by RT. Time of death called at 1930 by Dr. Benjiman CoreNathan Pickering. Family remains at bedside.

## 2013-05-16 NOTE — Progress Notes (Signed)
Past service note. PT was placed on vent after coding for approximately 20 minutes at which time she expired. FiO2 100, VT 450 rate 16 prvc no peep. 7.5 et tube 21 at lip. Pt was only in non arrest for brief period. SATs not available, etco2 off monitor. No blood gases.

## 2013-05-16 NOTE — ED Notes (Signed)
CPR on-going, syringe of epinephrine given. Pulse check completed, no pulse found CPR resumed.

## 2013-05-16 NOTE — ED Notes (Signed)
Pt husband reports left side chest/arm pain and numbness since yesterday. Pt pushed in front door in wheelchair by husband. Pt unresponsive,pale, weak pulse, agonal respirations. Upon entering room no pulses, no respirations. cpr initiated.

## 2013-05-16 NOTE — ED Notes (Signed)
Family removed gold colored ear rings from pt.

## 2013-05-16 NOTE — ED Notes (Signed)
No family in family room or in patient room.

## 2013-05-16 NOTE — ED Notes (Addendum)
Donor center called 2002, spoke w/ Melissa.

## 2013-05-16 NOTE — ED Notes (Addendum)
CPR remained on-going.Pt moved from room 18 to room 2. Epinephrine drip changed to 610mcg/min per EDP order. Second IV obtained,attempted to get blood back for I-stat labs. No blood able to be obtained.  22 gauge IV in left forearm obtained at 1915. IV flushed and remained patent. Pt husband brought into room by EDP and asked about pt/family wishes regarding plan of care. Pulse check and EKG completed. No pulse found. Epinephrine drip, NS bolus stopped at 1920. Additional family brought in and EDP talked with family. All resuscitative efforts stopped. Pt still monitored by Zole and ED monitor.

## 2013-05-16 NOTE — ED Notes (Addendum)
CPR remains on-going 1 syringe of epinephrine admin. Re-assessed pulse at 1847, no pulse found given verbal order to admin 1 syringe of bicarb.

## 2013-05-16 NOTE — ED Notes (Signed)
Patient dentures and shoes given to husband and he took them to the car

## 2013-05-16 NOTE — ED Notes (Signed)
Pt transported to morgue 

## 2013-05-16 NOTE — ED Notes (Signed)
CPR resumed, I syringe of epinephrine admin.

## 2013-05-16 NOTE — ED Notes (Addendum)
Pt placed on zole monitor,CPR started, 20 gauge IV started in right AC.  1 syringe of epinephrine, 1 mg of atropine admin, Normal Saline bolus started and infusing. POC CBG ordered with a result of 162. Stopped CPR for pulse check. No pulse found, CPR resumed at 1836.

## 2013-05-16 DEATH — deceased
